# Patient Record
Sex: Male | Born: 1980 | Race: White | Hispanic: No | Marital: Married | Smoking: Former smoker
Health system: Southern US, Community
[De-identification: ages and names within clinical notes are randomized; demographics above are authoritative.]

## PROBLEM LIST (undated history)

## (undated) DIAGNOSIS — G43909 Migraine, unspecified, not intractable, without status migrainosus: Secondary | ICD-10-CM

## (undated) DIAGNOSIS — Z789 Other specified health status: Secondary | ICD-10-CM

## (undated) DIAGNOSIS — K219 Gastro-esophageal reflux disease without esophagitis: Secondary | ICD-10-CM

## (undated) DIAGNOSIS — T7840XA Allergy, unspecified, initial encounter: Secondary | ICD-10-CM

## (undated) HISTORY — DX: Gastro-esophageal reflux disease without esophagitis: K21.9

## (undated) HISTORY — PX: APPENDECTOMY: SHX54

## (undated) HISTORY — PX: TONSILLECTOMY: SUR1361

## (undated) HISTORY — PX: VASECTOMY: SHX75

## (undated) HISTORY — DX: Allergy, unspecified, initial encounter: T78.40XA

---

## 2020-07-20 ENCOUNTER — Ambulatory Visit
Admission: EM | Admit: 2020-07-20 | Discharge: 2020-07-20 | Disposition: A | Discharge status: Home / Self Care | Payer: Commercial Managed Care - PPO | Attending: Family Medicine | Admitting: Family Medicine

## 2020-07-20 ENCOUNTER — Ambulatory Visit: Payer: Self-pay

## 2020-07-20 DIAGNOSIS — R0981 Nasal congestion: Secondary | ICD-10-CM

## 2020-07-20 DIAGNOSIS — H6591 Unspecified nonsuppurative otitis media, right ear: Secondary | ICD-10-CM

## 2020-07-20 DIAGNOSIS — R519 Headache, unspecified: Secondary | ICD-10-CM

## 2020-07-20 DIAGNOSIS — H9201 Otalgia, right ear: Secondary | ICD-10-CM

## 2020-07-20 DIAGNOSIS — Z1152 Encounter for screening for COVID-19: Secondary | ICD-10-CM

## 2020-07-20 DIAGNOSIS — R059 Cough, unspecified: Secondary | ICD-10-CM

## 2020-07-20 HISTORY — DX: Other specified health status: Z78.9

## 2020-07-20 MED ORDER — AMOXICILLIN-POT CLAVULANATE 875-125 MG PO TABS: 1.0000 | ORAL_TABLET | Freq: Two times a day (BID) | ORAL | 0 refills | Status: AC

## 2020-07-20 NOTE — Discharge Instructions (Addendum)
I have sent in Augmentin to your pharmacy  Take the antibiotic twice a day for 10 days  Your COVID test is pending.  You should self quarantine until the test result is back.    Take Tylenol as needed for fever or discomfort.  Rest and keep yourself hydrated.    Go to the emergency department if you develop acute worsening symptoms.

## 2020-07-20 NOTE — ED Provider Notes (Signed)
[facility]   [id number] 07/20/20 Arrival Time: 1549   CC: COVID symptoms  SUBJECTIVE: History from: patient.  Chase White is a 39 y.o. male who presents with abrupt onset of nasal congestion, PND, right ear pain, and persistent dry cough for the last week. Denies sick exposure to COVID, flu or strep. Denies recent travel. Has negative history of Covid. Has not completed Covid vaccines. Has taken OTC cough and cold medications for this with little relief. There are no aggravating or alleviating factors. Denies previous symptoms in the past. Denies fever, chills, fatigue, sinus pain, rhinorrhea, sore throat, SOB, wheezing, chest pain, nausea, changes in bowel or bladder habits.    ROS: As per HPI.  All other pertinent ROS negative.     Past Medical History:  Diagnosis Date   Patient on ketogenic diet    Past Surgical History:  Procedure Laterality Date   APPENDECTOMY     TONSILLECTOMY     VASECTOMY     Allergies  Allergen Reactions   Codeine    No current facility-administered medications on file prior to encounter.   No current outpatient medications on file prior to encounter.   Social History   Socioeconomic History   Marital status: Not on file    Spouse name: Not on file   Number of children: Not on file   Years of education: Not on file   Highest education level: Not on file  Occupational History   Not on file  Tobacco Use   Smoking status: Former Smoker   Smokeless tobacco: Never Used  Vaping Use   Vaping Use: Never used  Substance and Sexual Activity   Alcohol use: Yes    Alcohol/week: 2.0 - 3.0 standard drinks    Types: 2 - 3 Standard drinks or equivalent per week   Drug use: Not on file   Sexual activity: Not on file  Other Topics Concern   Not on file  Social History Narrative   Not on file   Social Determinants of Health   Financial Resource Strain:    Difficulty of Paying Living Expenses: Not on file  Food  Insecurity:    Worried About Running Out of Food in the Last Year: Not on file   Ran Out of Food in the Last Year: Not on file  Transportation Needs:    Lack of Transportation (Medical): Not on file   Lack of Transportation (Non-Medical): Not on file  Physical Activity:    Days of Exercise per Week: Not on file   Minutes of Exercise per Session: Not on file  Stress:    Feeling of Stress : Not on file  Social Connections:    Frequency of Communication with Friends and Family: Not on file   Frequency of Social Gatherings with Friends and Family: Not on file   Attends Religious Services: Not on file   Active Member of Clubs or Organizations: Not on file   Attends Banker Meetings: Not on file   Marital Status: Not on file  Intimate Partner Violence:    Fear of Current or Ex-Partner: Not on file   Emotionally Abused: Not on file   Physically Abused: Not on file   Sexually Abused: Not on file   History reviewed. No pertinent family history.  OBJECTIVE:  Vitals:   07/20/20 1553  BP: 124/79  Pulse: 82  Resp: 12  Temp: 98.4 F (36.9 C)  SpO2: 95%     General appearance: alert; appears fatigued,  but nontoxic; speaking in full sentences and tolerating own secretions HEENT: NCAT; Ears: EACs clear, L TM pearly gray, R TM erythematous, bulging, with effusion; Eyes: PERRL.  EOM grossly intact. Sinuses: nontender; Nose: nares patent without rhinorrhea, Throat: oropharynx clear, tonsils non erythematous or enlarged, uvula midline  Neck: supple without LAD Lungs: unlabored respirations, symmetrical air entry; cough: absent; no respiratory distress; CTAB Heart: regular rate and rhythm.  Radial pulses 2+ symmetrical bilaterally Skin: warm and dry Psychological: alert and cooperative; normal mood and affect  LABS:  No results found for this or any previous visit (from the past 24 hour(s)).   ASSESSMENT & PLAN:  1. Right otitis media with effusion   2.  Cough   3. Nasal congestion   4. Ear pain, right   5. Nonintractable headache, unspecified chronicity pattern, unspecified headache type   6. Encounter for screening for COVID-19     Meds ordered this encounter  Medications   amoxicillin-clavulanate (AUGMENTIN) 875-125 MG tablet    Sig: Take 1 tablet by mouth 2 (two) times daily for 10 days.    Dispense:  20 tablet    Refill:  0    Order Specific Question:   Supervising Provider    Answer:   Merrilee Jansky [id number]    Prescribed Augmentin for OM Take as directed and to completion  COVID testing ordered.  It will take between 1-2 days for test results.  Someone will contact you regarding abnormal results.    Patient should remain in quarantine until they have received Covid results.  If negative you may resume normal activities (go back to work/school) while practicing hand hygiene, social distance, and mask wearing.  If positive, patient should remain in quarantine for 10 days from symptom onset AND greater than 72 hours after symptoms resolution (absence of fever without the use of fever-reducing medication and improvement in respiratory symptoms), whichever is longer Get plenty of rest and push fluids Use OTC zyrtec for nasal congestion, runny nose, and/or sore throat Use OTC flonase for nasal congestion and runny nose Use medications daily for symptom relief Use OTC medications like ibuprofen or tylenol as needed fever or pain Call or go to the ED if you have any new or worsening symptoms such as fever, worsening cough, shortness of breath, chest tightness, chest pain, turning blue, changes in mental status.  Reviewed expectations re: course of current medical issues. Questions answered. Outlined signs and symptoms indicating need for more acute intervention. Patient verbalized understanding. After Visit Summary given.         Moshe Cipro, NP 07/20/20 1622

## 2020-07-20 NOTE — ED Triage Notes (Signed)
Patient reports symptoms began 1 week ago. Reports he originally thought it was allergies, but is having continued sore throat, cough, nasal congestion, and headaches. Reports he did an at home covid test yesterday which came back negative.

## 2020-07-22 LAB — SARS-COV-2, NAA 2 DAY TAT

## 2020-07-22 LAB — NOVEL CORONAVIRUS, NAA: SARS-CoV-2, NAA: NOT DETECTED

## 2020-10-03 ENCOUNTER — Ambulatory Visit
Admission: EM | Admit: 2020-10-03 | Discharge: 2020-10-03 | Disposition: A | Discharge status: Home / Self Care | Payer: Commercial Managed Care - PPO

## 2020-10-03 DIAGNOSIS — G43801 Other migraine, not intractable, with status migrainosus: Secondary | ICD-10-CM

## 2020-10-03 DIAGNOSIS — Z1152 Encounter for screening for COVID-19: Secondary | ICD-10-CM | POA: Diagnosis not present

## 2020-10-03 DIAGNOSIS — R519 Headache, unspecified: Secondary | ICD-10-CM

## 2020-10-03 MED ORDER — KETOROLAC TROMETHAMINE 30 MG/ML IJ SOLN
30.0000 mg | Freq: Once | INTRAMUSCULAR | Status: AC
  Administered 2020-10-03: 30 mg via INTRAMUSCULAR

## 2020-10-03 MED ORDER — RIZATRIPTAN BENZOATE 5 MG PO TABS: 5.0000 mg | ORAL_TABLET | ORAL | 0 refills | Status: DC | PRN

## 2020-10-03 MED ORDER — DEXAMETHASONE SODIUM PHOSPHATE 10 MG/ML IJ SOLN
10.0000 mg | Freq: Once | INTRAMUSCULAR | Status: AC
  Administered 2020-10-03: 10 mg via INTRAMUSCULAR

## 2020-10-03 NOTE — Discharge Instructions (Addendum)
You have received decadron and toradol in the office today  I have sent in Maxalt for you to take as needed for migraine  Your COVID/Flu test is pending.  You should self quarantine until the test result is back.    Take Tylenol as needed for fever or discomfort.  Rest and keep yourself hydrated.    Go to the emergency department if you develop acute worsening symptoms.      Follow up with this office or with primary care if symptoms are persisting.  Follow up in the ER for high fever, trouble swallowing, trouble breathing, other concerning symptoms.

## 2020-10-03 NOTE — ED Triage Notes (Signed)
Pt presents with HA.  Started with migraine, visual disturbances on 10/30.  That lasted a few hours then headache started 10/31.  Location changes in head. Describes as throbbing with periodic stabbing.  Intermittent blurriness in L eye.  No noticeable relief with Tylenol and Ibuprofen.

## 2020-10-03 NOTE — ED Provider Notes (Signed)
[facility]   [id number] 10/03/20 Arrival Time: 1443  CC: HEADACHE  SUBJECTIVE:  Chase White is a 39 y.o. male who complains of headache for the last 9 days. Denies a precipitating event, or recent head trauma. Describes the pain as constant and throbbing in character. Patient has tried OTC ibuprofen and tylenol without relief. There are no aggravating or alleviating factors. Reports similar symptoms in the past that improved with tylenol. Reports that he had a televisit and they suggested that he come in and be tested for Covid. Requesting testing today. This is not the worst headache of their life. Patient denies fever, chills, nausea, vomiting, aura, rhinorrhea, watery eyes, chest pain, SOB, abdominal pain, weakness, numbness or tingling, slurred speech.     ROS: As per HPI.  All other pertinent ROS negative.     Past Medical History:  Diagnosis Date  . Patient on ketogenic diet    Past Surgical History:  Procedure Laterality Date  . APPENDECTOMY    . TONSILLECTOMY    . VASECTOMY     Allergies  Allergen Reactions  . Codeine    No current facility-administered medications on file prior to encounter.   Current Outpatient Medications on File Prior to Encounter  Medication Sig Dispense Refill  . polyethylene glycol (MIRALAX / GLYCOLAX) 17 g packet Take 17 g by mouth daily.     Social History   Socioeconomic History  . Marital status: Married    Spouse name: Not on file  . Number of children: Not on file  . Years of education: Not on file  . Highest education level: Not on file  Occupational History  . Not on file  Tobacco Use  . Smoking status: Former Games developer  . Smokeless tobacco: Never Used  Vaping Use  . Vaping Use: Never used  Substance and Sexual Activity  . Alcohol use: Yes    Alcohol/week: 2.0 - 3.0 standard drinks    Types: 2 - 3 Standard drinks or equivalent per week  . Drug use: Not on file  . Sexual activity: Not on file  Other Topics  Concern  . Not on file  Social History Narrative  . Not on file   Social Determinants of Health   Financial Resource Strain:   . Difficulty of Paying Living Expenses: Not on file  Food Insecurity:   . Worried About Programme researcher, broadcasting/film/video in the Last Year: Not on file  . Ran Out of Food in the Last Year: Not on file  Transportation Needs:   . Lack of Transportation (Medical): Not on file  . Lack of Transportation (Non-Medical): Not on file  Physical Activity:   . Days of Exercise per Week: Not on file  . Minutes of Exercise per Session: Not on file  Stress:   . Feeling of Stress : Not on file  Social Connections:   . Frequency of Communication with Friends and Family: Not on file  . Frequency of Social Gatherings with Friends and Family: Not on file  . Attends Religious Services: Not on file  . Active Member of Clubs or Organizations: Not on file  . Attends Banker Meetings: Not on file  . Marital Status: Not on file  Intimate Partner Violence:   . Fear of Current or Ex-Partner: Not on file  . Emotionally Abused: Not on file  . Physically Abused: Not on file  . Sexually Abused: Not on file   No family history on file.  OBJECTIVE:  Vitals:   10/03/20 1519  BP: 120/76  Resp: 18  Temp: 99.2 F (37.3 C)  TempSrc: Oral  SpO2: 97%    General appearance: alert; no distress Eyes: PERRLA; EOMI HENT: normocephalic; atraumatic Neck: supple with FROM Lungs: clear to auscultation bilaterally Heart: regular rate and rhythm.  Radial pulses 2+ symmetrical bilaterally Extremities: no edema; symmetrical with no gross deformities Skin: warm and dry Neurologic: CN 2-12 grossly intact; finger to nose without difficulty; normal gait; strength and sensation intact bilaterally about the upper and lower extremities; negative pronator drift Psychological: alert and cooperative; normal mood and affect   ASSESSMENT & PLAN:  1. Other migraine with status migrainosus, not  intractable   2. Nonintractable headache, unspecified chronicity pattern, unspecified headache type   3. Encounter for screening for COVID-19     Meds ordered this encounter  Medications  . dexamethasone (DECADRON) injection 10 mg  . ketorolac (TORADOL) 30 MG/ML injection 30 mg  . rizatriptan (MAXALT) 5 MG tablet    Sig: Take 1 tablet (5 mg total) by mouth as needed for migraine. May repeat in 2 hours if needed    Dispense:  10 tablet    Refill:  0    Order Specific Question:   Supervising Provider    Answer:   Merrilee Jansky [[id number]]    Decadron 10mg  IM in office today Toradol 30mg  IM in office today Prescribed Maxalt Migraine cocktail given in office Rest and drink plenty of fluids Use OTC medications as needed for symptomatic relief Covid/Flu swab obtained in office today.  Patient instructed to quarantine until results are back and negative.  If results are negative, patient may resume daily schedule as tolerated once they are fever free for 24 hours without the use of antipyretic medications.  If results are positive, patient instructed to quarantine 10 days from today.  Patient instructed to follow-up with primary care with this office as needed.  Patient instructed to follow-up in the ER for trouble swallowing, trouble breathing, other concerning symptoms.  Reviewed expectations re: course of current medical issues. Questions answered. Outlined signs and symptoms indicating need for more acute intervention. Patient verbalized understanding. After Visit Summary given.   , NP 10/03/20 1553

## 2020-10-04 LAB — COVID-19, FLU A+B AND RSV
Influenza A, NAA: NOT DETECTED
Influenza B, NAA: NOT DETECTED
RSV, NAA: NOT DETECTED
SARS-CoV-2, NAA: NOT DETECTED

## 2020-10-13 ENCOUNTER — Other Ambulatory Visit: Payer: Self-pay | Admitting: Nurse Practitioner

## 2020-10-13 DIAGNOSIS — R42 Dizziness and giddiness: Secondary | ICD-10-CM

## 2020-10-13 DIAGNOSIS — G43019 Migraine without aura, intractable, without status migrainosus: Secondary | ICD-10-CM

## 2020-10-13 DIAGNOSIS — H8112 Benign paroxysmal vertigo, left ear: Secondary | ICD-10-CM

## 2020-10-14 ENCOUNTER — Other Ambulatory Visit: Payer: Self-pay | Admitting: Nurse Practitioner

## 2020-10-17 ENCOUNTER — Other Ambulatory Visit: Payer: Self-pay | Admitting: Nurse Practitioner

## 2020-10-18 ENCOUNTER — Ambulatory Visit
Admission: RE | Admit: 2020-10-18 | Discharge: 2020-10-18 | Disposition: A | Discharge status: Home / Self Care | Payer: Commercial Managed Care - PPO | Source: Ambulatory Visit | Attending: Nurse Practitioner | Admitting: Nurse Practitioner

## 2020-10-18 DIAGNOSIS — R42 Dizziness and giddiness: Secondary | ICD-10-CM

## 2020-10-18 DIAGNOSIS — G43019 Migraine without aura, intractable, without status migrainosus: Secondary | ICD-10-CM

## 2020-10-18 DIAGNOSIS — H8112 Benign paroxysmal vertigo, left ear: Secondary | ICD-10-CM

## 2020-10-27 ENCOUNTER — Encounter: Payer: Self-pay | Admitting: Emergency Medicine

## 2020-10-27 ENCOUNTER — Emergency Department
Admission: EM | Admit: 2020-10-27 | Discharge: 2020-10-27 | Disposition: A | Discharge status: Home / Self Care | Payer: Commercial Managed Care - PPO | Attending: Emergency Medicine | Admitting: Emergency Medicine

## 2020-10-27 ENCOUNTER — Other Ambulatory Visit: Payer: Self-pay

## 2020-10-27 DIAGNOSIS — G43909 Migraine, unspecified, not intractable, without status migrainosus: Secondary | ICD-10-CM | POA: Insufficient documentation

## 2020-10-27 DIAGNOSIS — G8929 Other chronic pain: Secondary | ICD-10-CM | POA: Diagnosis not present

## 2020-10-27 DIAGNOSIS — R519 Headache, unspecified: Secondary | ICD-10-CM | POA: Diagnosis present

## 2020-10-27 DIAGNOSIS — G43719 Chronic migraine without aura, intractable, without status migrainosus: Secondary | ICD-10-CM

## 2020-10-27 DIAGNOSIS — Z87891 Personal history of nicotine dependence: Secondary | ICD-10-CM | POA: Diagnosis not present

## 2020-10-27 NOTE — ED Provider Notes (Signed)
[facility] Emergency Department Provider Note   ____________________________________________   First MD Initiated Contact with Patient 10/27/20 1031     (approximate)  I have reviewed the triage vital signs and the nursing notes.   HISTORY  Chief Complaint Headache    HPI Chase White is a 39 y.o. male patient presents with continued headache for 1 month.  Patient was evaluated by his PCP and also had a negative CT scan  last month.  Patient state mild transient relief with Maxalt.  Patient also stated headaches increased with computer screen usage.  Patient voices concern with his PCP but has not follow-up with ophthalmologist because he had a exam in February or March of this year.  Patient denies aura with this headache.  Patient denies photo sensitivity.   Patient  request MRI today.   Past Medical History:  Diagnosis Date  . Patient on ketogenic diet     There are no problems to display for this patient.   Past Surgical History:  Procedure Laterality Date  . APPENDECTOMY    . TONSILLECTOMY    . VASECTOMY      Prior to Admission medications   Medication Sig Start Date End Date Taking? Authorizing Provider  polyethylene glycol (MIRALAX / GLYCOLAX) 17 g packet Take 17 g by mouth daily.    [provider]  rizatriptan (MAXALT) 5 MG tablet Take 1 tablet (5 mg total) by mouth as needed for migraine. May repeat in 2 hours if needed 10/03/20   Moshe Cipro, NP    Allergies Codeine  History reviewed. No pertinent family history.  Social History Social History   Tobacco Use  . Smoking status: Former Games developer  . Smokeless tobacco: Never Used  Vaping Use  . Vaping Use: Never used  Substance Use Topics  . Alcohol use: Yes    Alcohol/week: 2.0 - 3.0 standard drinks    Types: 2 - 3 Standard drinks or equivalent per week  . Drug use: Not on file    Review of Systems Constitutional: No fever/chills Eyes: No visual  changes. ENT: No sore throat. Cardiovascular: Denies chest pain. Respiratory: Denies shortness of breath. Gastrointestinal: No abdominal pain.  No nausea, no vomiting.  No diarrhea.  No constipation. Genitourinary: Negative for dysuria. Musculoskeletal: Negative for back pain. Skin: Negative for rash. Neurological: Positive for headaches, but denies focal weakness or numbness. Allergic/Immunilogical: Codeine.  ____________________________________________   PHYSICAL EXAM:  VITAL SIGNS: ED Triage Vitals [10/27/20 0955]  Enc Vitals Group     BP 131/72     Pulse Rate 82     Resp 18     Temp 98.5 F (36.9 C)     Temp Source Oral     SpO2 96 %     Weight 180 lb (81.6 kg)     Height 5\' 11"  (1.803 m)     Head Circumference      Peak Flow      Pain Score 2     Pain Loc      Pain Edu?      Excl. in GC?    Constitutional: Alert and oriented. Well appearing and in no acute distress. Head: Atraumatic. Cardiovascular: Normal rate, regular rhythm. Grossly normal heart sounds.  Good peripheral circulation. Respiratory: Normal respiratory effort.  No retractions. Lungs CTAB. Gastrointestinal: Soft and nontender. No distention. No abdominal bruits. No CVA tenderness. Musculoskeletal: No lower extremity tenderness nor edema.  No joint effusions. Neurologic:  Normal speech and language. No  gross focal neurologic deficits are appreciated. No gait instability. Skin:  Skin is warm, dry and intact. No rash noted. Psychiatric: Mood and affect are normal. Speech and behavior are normal.  ____________________________________________   LABS (all labs ordered are listed, but only abnormal results are displayed)  Labs Reviewed - No data to display ____________________________________________  EKG  ____________________________________________  RADIOLOGY I, Joni Reining, personally viewed and evaluated these images (plain radiographs) as part of my medical decision making, as well as  reviewing the written report by the radiologist.  ED MD interpretation:    Official radiology report(s): No results found.  ____________________________________________   PROCEDURES  Procedure(s) performed (including Critical Care):  Procedures   ____________________________________________   INITIAL IMPRESSION / ASSESSMENT AND PLAN / ED COURSE  As part of my medical decision making, I reviewed the following data within the electronic MEDICAL RECORD NUMBER         Patient presents with continued headache for 4 months.  Patient states mild improvement since starting Maxalt.  Explained rationale for not performing emergent MRI of his head.  Patient given discharge care instruction and a consult to neurology.  Patient also advised to schedule an appointment with his optometrist/ophthalmologist for evaluation of computer screen headache.   ____________________________________________   FINAL CLINICAL IMPRESSION(S) / ED DIAGNOSES  Final diagnoses:  Intractable chronic migraine without aura and without status migrainosus     ED Discharge Orders    None      *Please note:  Chase White was evaluated in Emergency Department on 10/27/2020 for the symptoms described in the history of present illness. He was evaluated in the context of the global COVID-19 pandemic, which necessitated consideration that the patient might be at risk for infection with the SARS-CoV-2 virus that causes COVID-19. Institutional protocols and algorithms that pertain to the evaluation of patients at risk for COVID-19 are in a state of rapid change based on information released by regulatory bodies including the CDC and federal and state organizations. These policies and algorithms were followed during the patient's care in the ED.  Some ED evaluations and interventions may be delayed as a result of limited staffing during and the pandemic.*   Note:  This document was prepared using Dragon voice recognition  software and may include unintentional dictation errors.    Joni Reining, PA-C 10/27/20 1157    Arnaldo Natal, MD 10/27/20 [phone]

## 2020-10-27 NOTE — Discharge Instructions (Addendum)
Follow discharge care instruction and continue previous medication.  Call the neurologist listed on your discharge care instructions.  Tell them you are a follow-up from emergency room.  Also advised you to see optometrist/ophthalmologist to evaluate your computer screen headaches.

## 2020-10-27 NOTE — ED Triage Notes (Signed)
Pt comes into the ED via POV c/o headache x 1 month.  Pt is currently neurologically intact but states the headache is only worse when looking at his computer screen.  Pt does wear Rx glasses with last eye exam feb/march of this year.  Pt currently on meclizine for dizzy spells and takes it as needed.  Pt in NAD at this time with even and unlabored respirations.

## 2020-10-28 ENCOUNTER — Encounter: Payer: Self-pay | Admitting: Neurology

## 2020-11-15 NOTE — Progress Notes (Signed)
NEUROLOGY CONSULTATION NOTE  Nasim Habeeb MRN: [id number] DOB: May 08, 1981  Referring provider: ED referral Primary care provider: [facility]  Reason for consult:  migraine   Subjective:  Chase White is a 39 year old right-handed male who presents for migraines.  History supplemented by ED notes.  He had a first-time migraine over this past year.  it was severe but can't remember head location.  It was preceded by a streak of light across his vision, and associated with nausea, motion sickness, photophobia and phonophobia.  No weakness and numbness.  No known trigger.  It lasted about 4-5 hours and resolved  He had a second similar headache on 10/30 that occurred spontaneously.  Over the next week it would come and go without aura, lasting a couple of hours.  He went to [facility] on 10/03/2020 for intractable headache after being advised to be tested for COVID-19, which was negative.  He had a CT of the head on 10/18/2020 which was personally reviewed and was negative.  It continued to be daily, but without aura.  He was placed on daily Ubrelvy which helped.  Due to ongoing headaches, he went to the ED at [facility] on 10/27/2020 where he was given referral to neurology and advised to follow up with his eye doctor.  Last severe headache roughly a week later.  He still has a dull persistent left retro-orbital pressure.  He has been taking ibuprofen about once a week.  Work is all on computer so screen time aggravates it.  Since these headaches, he has been very dizzy and prone to motion sickness, such as riding in a car.  Current NSAIDS/analgesics:  Ibuprofen 800mg  Current triptans:  Maxalt 5mg  Current ergotamine:  none Current anti-emetic:  none Current muscle relaxants:  none Current Antihypertensive medications:  none Current Antidepressant medications:  none Current Anticonvulsant medications:  none Current anti-CGRP:  none Current Vitamins/Herbal/Supplements:   none Current Antihistamines/Decongestants:  none Other therapy:  none Hormone/birth control:  none  Past NSAIDS/analgesics:  none Past abortive triptans:  none Past abortive ergotamine:  none Past muscle relaxants:  none Past anti-emetic:  none Past antihypertensive medications:  none Past antidepressant medications:  none Past anticonvulsant medications:  none Past anti-CGRP:  Past vitamins/Herbal/Supplements:  none Past antihistamines/decongestants:  meclizine Other past therapies:  none  Caffeine:  Drinks on average 2 cups coffee daily.  No soda. Diet:  Drinks plenty of water.  Keto diet (over 3 years) Exercise:  not Depression:  no; Anxiety:  no Other pain:  Shoulder pain Sleep hygiene: lately restless but usually good. Family history of headache:  He thinks migraines may run in the family.   PAST MEDICAL HISTORY: Past Medical History:  Diagnosis Date  . Patient on ketogenic diet     PAST SURGICAL HISTORY: Past Surgical History:  Procedure Laterality Date  . APPENDECTOMY    . TONSILLECTOMY    . VASECTOMY      MEDICATIONS: Current Outpatient Medications on File Prior to Visit  Medication Sig Dispense Refill  . polyethylene glycol (MIRALAX / GLYCOLAX) 17 g packet Take 17 g by mouth daily.    . rizatriptan (MAXALT) 5 MG tablet Take 1 tablet (5 mg total) by mouth as needed for migraine. May repeat in 2 hours if needed 10 tablet 0   No current facility-administered medications on file prior to visit.    ALLERGIES: Allergies  Allergen Reactions  . Codeine     FAMILY HISTORY: History reviewed. No  pertinent family history.   SOCIAL HISTORY: Social History   Socioeconomic History  . Marital status: Married    Spouse name: Not on file  . Number of children: Not on file  . Years of education: Not on file  . Highest education level: Not on file  Occupational History  . Not on file  Tobacco Use  . Smoking status: Former Games developer  . Smokeless  tobacco: Never Used  Vaping Use  . Vaping Use: Never used  Substance and Sexual Activity  . Alcohol use: Yes    Alcohol/week: 2.0 - 3.0 standard drinks    Types: 2 - 3 Standard drinks or equivalent per week  . Drug use: Not on file  . Sexual activity: Not on file  Other Topics Concern  . Not on file  Social History Narrative  . Not on file   Social Determinants of Health   Financial Resource Strain: Not on file  Food Insecurity: Not on file  Transportation Needs: Not on file  Physical Activity: Not on file  Stress: Not on file  Social Connections: Not on file  Intimate Partner Violence: Not on file    Objective:  Blood pressure 104/71, pulse 97, height 5\' 11"  (1.803 m), weight 187 lb (84.8 kg), SpO2 91 %. General: No acute distress.  Patient appears well-groomed.   Head:  Normocephalic/atraumatic Eyes:  fundi examined but not visualized Neck: supple, no paraspinal tenderness, full range of motion Back: No paraspinal tenderness Heart: regular rate and rhythm Lungs: Clear to auscultation bilaterally. Vascular: No carotid bruits. Neurological Exam: Mental status: alert and oriented to person, place, and time, recent and remote memory intact, fund of knowledge intact, attention and concentration intact, speech fluent and not dysarthric, language intact. Cranial nerves: CN I: not tested CN II: pupils equal, round and reactive to light, visual fields intact CN III, IV, VI:  full range of motion, no nystagmus, no ptosis CN V: facial sensation intact. CN VII: upper and lower face symmetric CN VIII: hearing intact CN IX, X: gag intact, uvula midline CN XI: sternocleidomastoid and trapezius muscles intact CN XII: tongue midline Bulk & Tone: normal, no fasciculations. Motor:  muscle strength 5/5 throughout Sensation:  Pinprick, temperature and vibratory sensation intact. Deep Tendon Reflexes:  2+ throughout,  toes downgoing.   Finger to nose testing:  Without dysmetria.    Heel to shin:  Without dysmetria.   Gait:  Normal station and stride.  Romberg negative.  Assessment/Plan:   1.  Migraine with aura, without status migrainosus, not intractable 2.  New daily headaches 3.  Dizziness/dysequilibrium   1.  As these are new daily headaches, we will order MRI of brain with and without contrast to rule out secondary intracranial abnormality 2.  For migraine prevention:  Nortriptyline 25mg  at bedtime.  We can increase to 50mg  at bedtime in 4 to 6 weeks if needed. 3.  For migraine rescue:  Maxalt 10mg .  Ibuprofen for more mild headaches 4.  Limit use of pain relievers to no more than 2 days out of week to prevent risk of rebound or medication-overuse headache. 5.  Keep headache diary 6.  Recommend following accommodations for work:  Anti blue light screen filter for his computer and to avoid driving or riding in a vehicle at this time. 7.  Caffeine cessation, exercise 8.  Follow up 4 to 6 months.    Thank you for allowing me to take part in the care of this patient.  , DO

## 2020-11-16 ENCOUNTER — Ambulatory Visit (INDEPENDENT_AMBULATORY_CARE_PROVIDER_SITE_OTHER): Payer: Commercial Managed Care - PPO | Admitting: Neurology

## 2020-11-16 ENCOUNTER — Encounter: Payer: Self-pay | Admitting: Neurology

## 2020-11-16 ENCOUNTER — Other Ambulatory Visit: Payer: Self-pay

## 2020-11-16 VITALS — BP 104/71 | HR 97 | Ht 71.0 in | Wt 187.0 lb

## 2020-11-16 DIAGNOSIS — R42 Dizziness and giddiness: Secondary | ICD-10-CM | POA: Diagnosis not present

## 2020-11-16 DIAGNOSIS — G43109 Migraine with aura, not intractable, without status migrainosus: Secondary | ICD-10-CM

## 2020-11-16 DIAGNOSIS — R519 Headache, unspecified: Secondary | ICD-10-CM | POA: Diagnosis not present

## 2020-11-16 MED ORDER — NORTRIPTYLINE HCL 25 MG PO CAPS: 25.0000 mg | ORAL_CAPSULE | Freq: Every day | ORAL | 5 refills | Status: DC

## 2020-11-16 NOTE — Patient Instructions (Signed)
  1. MRI of brain with and without contrast 2. Start nortriptyline 25mg  at bedtime.  Contact in 4 to 6 weeks with update and we can increase dose if needed. 3. Take rizatriptan 10mg  at earliest onset of headache.  May repeat dose once in 2 hours if needed.  Maximum 2 tablets in 24 hours. 4. Limit use of pain relievers to no more than 2 days out of the week.  These medications include acetaminophen, NSAIDs (ibuprofen/Advil/Motrin, naproxen/Aleve, triptans (Imitrex/sumatriptan), Excedrin, and narcotics.  This will help reduce risk of rebound headaches. 5. Be aware of common food triggers:  - Caffeine:  coffee, black tea, cola, Mt. Dew  - Chocolate  - Dairy:  aged cheeses (brie, blue, cheddar, gouda, [place], provolone, [place], Swiss, etc), chocolate milk, buttermilk, sour cream, limit eggs and yogurt  - Nuts, peanut butter  - Alcohol  - Cereals/grains:  FRESH breads (fresh bagels, sourdough, doughnuts), yeast productions  - Processed/canned/aged/cured meats (pre-packaged deli meats, hotdogs)  - MSG/glutamate:  soy sauce, flavor enhancer, pickled/preserved/marinated foods  - Sweeteners:  aspartame (Equal, Nutrasweet).  Sugar and Splenda are okay  - Vegetables:  legumes (lima beans, lentils, snow peas, fava beans, pinto peans, peas, garbanzo beans), sauerkraut, onions, olives, pickles  - Fruit:  avocados, bananas, citrus fruit (orange, lemon, grapefruit), mango  - Other:  Frozen meals, macaroni and cheese 6. Routine exercise 7. Stay adequately hydrated (aim for 64 oz water daily) 8. Keep headache diary 9. Maintain proper stress management 10. Maintain proper sleep hygiene 11. Do not skip meals 12. Consider supplements:  magnesium citrate 400mg  daily, riboflavin 400mg  daily, coenzyme Q10 100mg  three times daily. 13. Follow up 4 to 6 months.

## 2020-12-05 ENCOUNTER — Telehealth: Payer: Self-pay | Admitting: Neurology

## 2020-12-05 NOTE — Telephone Encounter (Signed)
Patient called and said, "The medication, nortriptyline, is working well but it is wearing off before I can take another dose. Also, my heart rate has been high like 120 when I am sitting. Should I be concerned?"  [facility] [place]

## 2020-12-06 MED ORDER — TOPIRAMATE 25 MG PO TABS: ORAL_TABLET | ORAL | 3 refills | Status: DC

## 2020-12-06 NOTE — Telephone Encounter (Signed)
Pt advised of medication change and instructions

## 2020-12-06 NOTE — Telephone Encounter (Signed)
I would stop nortriptyline.  Instead, I would like to start topiramate 25mg  tablet.  Please send script for 25mg  at bedtime for one week, then increase to 50mg  at bedtime.  If no improvement by time of refill, he should contact and we can increase dose.  One side effect of the medication that some people may experience is numbness and tingling, so if he experiences this, not to be worried and it typically resolves once his body gets used to the medication.

## 2020-12-08 ENCOUNTER — Other Ambulatory Visit: Payer: Self-pay | Admitting: Neurology

## 2020-12-10 ENCOUNTER — Other Ambulatory Visit: Payer: Self-pay

## 2020-12-10 ENCOUNTER — Ambulatory Visit
Admission: RE | Admit: 2020-12-10 | Discharge: 2020-12-10 | Disposition: A | Discharge status: Home / Self Care | Payer: Commercial Managed Care - PPO | Source: Ambulatory Visit | Attending: Neurology | Admitting: Neurology

## 2020-12-10 DIAGNOSIS — R519 Headache, unspecified: Secondary | ICD-10-CM

## 2020-12-10 DIAGNOSIS — G43109 Migraine with aura, not intractable, without status migrainosus: Secondary | ICD-10-CM

## 2020-12-10 DIAGNOSIS — R42 Dizziness and giddiness: Secondary | ICD-10-CM

## 2020-12-10 MED ORDER — GADOBENATE DIMEGLUMINE 529 MG/ML IV SOLN
15.0000 mL | Freq: Once | INTRAVENOUS | Status: AC | PRN
  Administered 2020-12-10: 15 mL via INTRAVENOUS

## 2020-12-12 ENCOUNTER — Ambulatory Visit: Payer: Commercial Managed Care - PPO | Admitting: Neurology

## 2020-12-12 NOTE — Progress Notes (Signed)
Pt advised of his Mri results. Please keep scheduled f/u appt.

## 2021-03-23 ENCOUNTER — Other Ambulatory Visit: Payer: Self-pay

## 2021-03-23 ENCOUNTER — Telehealth: Payer: Self-pay | Admitting: Neurology

## 2021-03-23 DIAGNOSIS — G43801 Other migraine, not intractable, with status migrainosus: Secondary | ICD-10-CM

## 2021-03-23 MED ORDER — RIZATRIPTAN BENZOATE 5 MG PO TABS: 5.0000 mg | ORAL_TABLET | ORAL | 0 refills | Status: DC | PRN

## 2021-03-23 NOTE — Telephone Encounter (Signed)
Rx sent in to pharmacy. 

## 2021-03-23 NOTE — Telephone Encounter (Signed)
1. Which medications need to be refilled? (please list name of each medication and dose if known) Maxalt   2. Which pharmacy/location (including street and city if local pharmacy) is medication to be sent to? [facility] [place], [place]

## 2021-04-17 NOTE — Progress Notes (Signed)
NEUROLOGY FOLLOW UP OFFICE NOTE  Chase White [id number]  Assessment/Plan:   1.  Migraine with aura, without status migrainosus, not intractable 2.  Dizziness/disequilibrium, resolved 3.  Neck pain/left shoulder pain  1.  Migraine prevention:  Increase topiramate to 75mg  at bedtime 2.  Migraine rescue:  Rizatriptan 10mg  (for severe) or ibuprofen (for moderate) 3.  Refer to Sports Medicine regarding neck and left shoulder pain 4.  Limit use of pain relievers to no more than 2 days out of week to prevent risk of rebound or medication-overuse headache. 5.  Keep headache diary 6.  Follow up 6 months.  Subjective:  Chase White is a 40 year old right-handed male who follows up for migraine.  UPDATE: MRI of brain with and without contrast on 12/10/2020 personally reviewed was normal. Nortriptyline caused tachycardia, so he was changed to topiramate.  Intensity:  Moderate to severe Duration:  Less than an hour with rizatriptan, less than one hour but sometimes longer with the ibuprofen) Frequency:  3 a week (1 a week requiring rizatriptan)  Since starting topiramate, he keeps clenching his jaw which causes bi-temporal headaches.  However, he reports increased stress.  Since accident, he continues to have left shoulder pain radiating up the left side of his neck.   Current NSAIDS/analgesics:  Ibuprofen 800mg  Current triptans:  Maxalt 10mg  Current ergotamine:  none Current anti-emetic:  none Current muscle relaxants:  none Current Antihypertensive medications:  none Current Antidepressant medications:  none Current Anticonvulsant medications:  topiramate 50mg  QHS Current anti-CGRP:  none Current Vitamins/Herbal/Supplements:  none Current Antihistamines/Decongestants:  none Other therapy:  none Hormone/birth control:  none  Caffeine:  Drinks on average 2 cups coffee daily.  No soda. Diet:  Drinks plenty of water.  Keto diet (over 3 years) Exercise:  not Depression:   no; Anxiety:  no Other pain:  Shoulder pain Sleep hygiene: lately restless but usually good.  HISTORY: He had a first-time migraine over this past year.  it was severe but can't remember head location.  It was preceded by a streak of light across his vision, and associated with nausea, motion sickness, photophobia and phonophobia.  No weakness and numbness.  No known trigger.  It lasted about 4-5 hours and resolved  He had a second similar headache on 10/30 that occurred spontaneously.  Over the next week it would come and go without aura, lasting a couple of hours.  He went to [facility] on 10/03/2020 for intractable headache after being advised to be tested for COVID-19, which was negative.  He had a CT of the head on 10/18/2020 which was personally reviewed and was negative.  It continued to be daily, but without aura.  He was placed on daily Ubrelvy which helped.  Due to ongoing headaches, he went to the ED at [facility] on 10/27/2020 where he was given referral to neurology and advised to follow up with his eye doctor.  Last severe headache roughly a week later.  He still has a dull persistent left retro-orbital pressure.  He has been taking ibuprofen about once a week.  Work is all on computer so screen time aggravates it.  Since these headaches, he has been very dizzy and prone to motion sickness, such as riding in a car.   Past NSAIDS/analgesics:  none Past abortive triptans:  none Past abortive ergotamine:  none Past muscle relaxants:  none Past anti-emetic:  none Past antihypertensive medications:  none Past antidepressant medications:  nortriptyline (tachycardia) Past anticonvulsant  medications:  none Past anti-CGRP:  Bernita Raisin Past vitamins/Herbal/Supplements:  none Past antihistamines/decongestants:  meclizine Other past therapies:  none   Family history of headache:  He thinks migraines may run in the family.  PAST MEDICAL HISTORY: Past Medical History:  Diagnosis Date   . Patient on ketogenic diet     MEDICATIONS: Current Outpatient Medications on File Prior to Visit  Medication Sig Dispense Refill  . ibuprofen (ADVIL) 800 MG tablet Take 800 mg by mouth as needed.    . nortriptyline (PAMELOR) 25 MG capsule Take 1 capsule (25 mg total) by mouth at bedtime. 30 capsule 5  . polyethylene glycol (MIRALAX / GLYCOLAX) 17 g packet Take 17 g by mouth daily.    . rizatriptan (MAXALT) 5 MG tablet Take 1 tablet (5 mg total) by mouth as needed for migraine. May repeat in 2 hours if needed 10 tablet 0  . topiramate (TOPAMAX) 25 MG tablet Take 25mg  at bedtime for one week, then increase to 50mg  at bedtime. Pleas contact if you need to increase after 4 weeks 120 tablet 3   No current facility-administered medications on file prior to visit.    ALLERGIES: Allergies  Allergen Reactions  . Codeine     FAMILY HISTORY: No family history on file.    Objective:  Blood pressure 121/79, pulse 95, height 5\' 11"  (1.803 m), weight 188 lb (85.3 kg), SpO2 98 %. General: No acute distress.  Patient appears well-groomed.        , DO

## 2021-04-18 ENCOUNTER — Encounter: Payer: Self-pay | Admitting: Neurology

## 2021-04-18 ENCOUNTER — Other Ambulatory Visit: Payer: Self-pay

## 2021-04-18 ENCOUNTER — Ambulatory Visit (INDEPENDENT_AMBULATORY_CARE_PROVIDER_SITE_OTHER): Payer: Commercial Managed Care - PPO | Admitting: Neurology

## 2021-04-18 VITALS — BP 121/79 | HR 95 | Ht 71.0 in | Wt 188.0 lb

## 2021-04-18 DIAGNOSIS — G43109 Migraine with aura, not intractable, without status migrainosus: Secondary | ICD-10-CM | POA: Diagnosis not present

## 2021-04-18 DIAGNOSIS — M542 Cervicalgia: Secondary | ICD-10-CM

## 2021-04-18 DIAGNOSIS — M25512 Pain in left shoulder: Secondary | ICD-10-CM

## 2021-04-18 MED ORDER — TOPIRAMATE 25 MG PO TABS: 75.0000 mg | ORAL_TABLET | Freq: Every day | ORAL | 5 refills | Status: DC

## 2021-04-18 NOTE — Patient Instructions (Signed)
1.  Increase topiramate to 75mg  at bedtime 2.  Use rizatriptan or ibuprofen as needed.  But limit use of pain relievers to no more than 2 days out of week to prevent risk of rebound or medication-overuse headache. 3.  Refer to Sports Medicine for neck pain and left shoulder pain 4.  Follow up 6 months.

## 2021-04-20 NOTE — Progress Notes (Signed)
[facility] [address] [place] [address] Phone: [phone] Subjective:   I Chase White am serving as a Neurosurgeon for Dr. Antoine Primas.  This visit occurred during the SARS-CoV-2 public health emergency.  Safety protocols were in place, including screening questions prior to the visit, additional usage of staff PPE, and extensive cleaning of exam room while observing appropriate contact time as indicated for disinfecting solutions.   I'm seeing this patient by the request  of:  Jaffe DO  CC: neck pain   FKC:[id number]  Chase White is a 40 y.o. male coming in with complaint of neck and L shoulder pain. History of migraines. Patient states he thinks he injured his shoulder doing a virtual boxing class. Pain radiates the the left side of the neck. States he felt a pop before in the neck where his ear went numb for about a week. Some numbness in the fingertips that he believes is caused by his migraine medication. Shoulder and migraine medication was started at the same time so he is not sure if the shoulder is also causing the numbness. Pain comes and goes. States when he feels the pain he cannot move.   Onset- 6 months Location - anterior  Duration- most painful at night  Character- sharp, dull, achy, sore, throbbing Aggravating factors- extension, abduction, flexion, picking things up, quick movements, sleeping on the shoulder  Reliving factors-  Therapies tried- pain relief, heating pad  Severity- 9/10 at its worse    Patient did have an MRI of the brain done by his neurologist.  This was independently visualized by me and no significant abnormality noted.  Past Medical History:  Diagnosis Date  . Patient on ketogenic diet    Past Surgical History:  Procedure Laterality Date  . APPENDECTOMY    . TONSILLECTOMY    . VASECTOMY     Social History   Socioeconomic History  . Marital status: Married    Spouse name: Not on file  . Number  of children: Not on file  . Years of education: Not on file  . Highest education level: Not on file  Occupational History  . Not on file  Tobacco Use  . Smoking status: Former Games developer  . Smokeless tobacco: Never Used  Vaping Use  . Vaping Use: Never used  Substance and Sexual Activity  . Alcohol use: Yes    Alcohol/week: 2.0 - 3.0 standard drinks    Types: 2 - 3 Standard drinks or equivalent per week    Comment: occ.  . Drug use: Never  . Sexual activity: Yes  Other Topics Concern  . Not on file  Social History Narrative   Right handed   Social Determinants of Health   Financial Resource Strain: Not on file  Food Insecurity: Not on file  Transportation Needs: Not on file  Physical Activity: Not on file  Stress: Not on file  Social Connections: Not on file   Allergies  Allergen Reactions  . Codeine    No family history on file.     Current Outpatient Medications (Analgesics):  .  ibuprofen (ADVIL) 800 MG tablet, Take 800 mg by mouth as needed. .  rizatriptan (MAXALT) 5 MG tablet, Take 1 tablet (5 mg total) by mouth as needed for migraine. May repeat in 2 hours if needed   Current Outpatient Medications (Other):  .  polyethylene glycol (MIRALAX / GLYCOLAX) 17 g packet, Take 17 g by mouth daily. Marland Kitchen  topiramate (TOPAMAX)  25 MG tablet, Take 3 tablets (75 mg total) by mouth at bedtime. Marland Kitchen  venlafaxine XR (EFFEXOR XR) 37.5 MG 24 hr capsule, Take 1 capsule (37.5 mg total) by mouth daily with breakfast.   Reviewed prior external information including notes and imaging from  primary care provider As well as notes that were available from care everywhere and other healthcare systems.  Past medical history, social, surgical and family history all reviewed in electronic medical record.  No pertanent information unless stated regarding to the chief complaint.   Review of Systems:  No  visual changes, nausea, vomiting, diarrhea, constipation, dizziness, abdominal pain, skin  rash, fevers, chills, night sweats, weight loss, swollen lymph nodes, body aches, joint swelling, chest pain, shortness of breath, mood changes. POSITIVE muscle aches, headache  Objective  Blood pressure 138/80, pulse (!) 103, height 5\' 11"  (1.803 m), weight 188 lb (85.3 kg), SpO2 97 %.   General: No apparent distress alert and oriented x3 mood and affect normal, dressed appropriately.  HEENT: Pupils equal, extraocular movements intact  Respiratory: Patient's speak in full sentences and does not appear short of breath  Cardiovascular: No lower extremity edema, non tender, no erythema  Gait normal with good balance and coordination.  MSK: Left shoulder exam has nearly full range of motion.  Very mild positive impingement and crossover test noted.  5 out of 5 strength of the rotator cuff. Scapular dyskinesis noted on the left side.  Patient does have poor posture inferior winging of the scapula noted.  Tightness noted of the trapezius.  Mild tightness of the sternocleidomastoid muscle as well noted.  Otherwise fairly good range of motion of the neck.  MSK performed of: Left shoulder This study was ordered, performed, and interpreted by [place] D.O.  Shoulder:   Supraspinatus:  Appears normal on long and transverse views, no bursal bulge seen with shoulder abduction on impingement view. Subscapularis:  Appears normal on long and transverse views. AC joint:  Capsule undistended, no geyser sign.  Very mild arthritis Glenohumeral Joint:  Appears normal without effusion. Biceps Tendon:  Appears normal on long and transverse views, no fraying of tendon, tendon located in intertubercular groove, no subluxation with shoulder internal or external rotation. No increased power doppler signal. Impression: Normal ultrasound the shoulder  Osteopathic findings C4 flexed rotated and side bent left T7 extended and rotated right    Impression and Recommendations:     The above documentation has  been reviewed and is accurate and complete Terrilee Files, DO

## 2021-04-21 ENCOUNTER — Other Ambulatory Visit: Payer: Self-pay

## 2021-04-21 ENCOUNTER — Encounter: Payer: Self-pay | Admitting: Family Medicine

## 2021-04-21 ENCOUNTER — Ambulatory Visit (INDEPENDENT_AMBULATORY_CARE_PROVIDER_SITE_OTHER): Payer: Commercial Managed Care - PPO

## 2021-04-21 ENCOUNTER — Ambulatory Visit: Payer: Self-pay

## 2021-04-21 ENCOUNTER — Ambulatory Visit (INDEPENDENT_AMBULATORY_CARE_PROVIDER_SITE_OTHER): Payer: Commercial Managed Care - PPO | Admitting: Family Medicine

## 2021-04-21 VITALS — BP 138/80 | HR 103 | Ht 71.0 in | Wt 188.0 lb

## 2021-04-21 DIAGNOSIS — M9902 Segmental and somatic dysfunction of thoracic region: Secondary | ICD-10-CM

## 2021-04-21 DIAGNOSIS — M9908 Segmental and somatic dysfunction of rib cage: Secondary | ICD-10-CM

## 2021-04-21 DIAGNOSIS — M542 Cervicalgia: Secondary | ICD-10-CM

## 2021-04-21 DIAGNOSIS — G8929 Other chronic pain: Secondary | ICD-10-CM

## 2021-04-21 DIAGNOSIS — M9901 Segmental and somatic dysfunction of cervical region: Secondary | ICD-10-CM

## 2021-04-21 DIAGNOSIS — M25512 Pain in left shoulder: Secondary | ICD-10-CM

## 2021-04-21 HISTORY — DX: Cervicalgia: M54.2

## 2021-04-21 HISTORY — DX: Segmental and somatic dysfunction of cervical region: M99.01

## 2021-04-21 MED ORDER — VENLAFAXINE HCL ER 37.5 MG PO CP24: 37.5000 mg | ORAL_CAPSULE | Freq: Every day | ORAL | 0 refills | Status: DC

## 2021-04-21 NOTE — Assessment & Plan Note (Signed)
   Decision today to treat with OMT was based on Physical Exam  After verbal consent patient was treated with HVLA, ME, FPR techniques in cervical, thoracic, rib,areas, all areas are chronic   Patient tolerated the procedure well with improvement in symptoms  Patient given exercises, stretches and lifestyle modifications  See medications in patient instructions if given  Patient will follow up in 4-8 weeks 

## 2021-04-21 NOTE — Patient Instructions (Addendum)
Good to see you Shoulder and neck xray today  Scapular exercises effexor 37.5 daily Consider having Vitamin D, TSH, ESR Uric acid labs checked See me again in 5-6 weeks   Venlafaxine Tablets What is this medicine? VENLAFAXINE (VEN la fax een) is used to treat depression, anxiety and panic disorder. This medicine may be used for other purposes; ask your health care provider or pharmacist if you have questions. COMMON BRAND NAME(S): Effexor What should I tell my health care provider before I take this medicine? They need to know if you have any of these conditions:  bleeding disorders  glaucoma  heart disease  high blood pressure  high cholesterol  kidney disease  liver disease  low levels of sodium in the blood  mania or bipolar disorder  seizures  suicidal thoughts, plans, or attempt; a previous suicide attempt by you or a family  take medicines that treat or prevent blood clots  thyroid disease  an unusual or allergic reaction to venlafaxine, desvenlafaxine, other medicines, foods, dyes, or preservatives  pregnant or trying to get pregnant  breast-feeding How should I use this medicine? Take this medicine by mouth with a glass of water. Follow the directions on the prescription label. Take it with food. Take your medicine at regular intervals. Do not take your medicine more often than directed. Do not stop taking this medicine suddenly except upon the advice of your doctor. Stopping this medicine too quickly may cause serious side effects or your condition may worsen. A special MedGuide will be given to you by the pharmacist with each prescription and refill. Be sure to read this information carefully each time. Talk to your pediatrician regarding the use of this medicine in children. Special care may be needed. Overdosage: If you think you have taken too much of this medicine contact a poison control center or emergency room at once. NOTE: This medicine is only  for you. Do not share this medicine with others. What if I miss a dose? If you miss a dose, take it as soon as you can. If it is almost time for your next dose, take only that dose. Do not take double or extra doses. What may interact with this medicine? Do not take this medicine with any of the following medications:  certain medicines for fungal infections like fluconazole, itraconazole, ketoconazole, posaconazole, voriconazole  cisapride  desvenlafaxine  dronedarone  duloxetine  levomilnacipran  linezolid  MAOIs like Carbex, Eldepryl, Marplan, Nardil, and Parnate  methylene blue (injected into a vein)  milnacipran  pimozide  thioridazine This medicine may also interact with the following medications:  amphetamines  aspirin and aspirin-like medicines  certain medicines for depression, anxiety, or psychotic disturbances  certain medicines for migraine headaches like almotriptan, eletriptan, frovatriptan, naratriptan, rizatriptan, sumatriptan, zolmitriptan  certain medicines for sleep  certain medicines that treat or prevent blood clots like dalteparin, enoxaparin, warfarin  cimetidine  clozapine  diuretics  fentanyl  furazolidone  indinavir  isoniazid  lithium  metoprolol  NSAIDS, medicines for pain and inflammation, like ibuprofen or naproxen  other medicines that prolong the QT interval (cause an abnormal heart rhythm) like dofetilide, ziprasidone  procarbazine  rasagiline  supplements like St. John's wort, kava kava, valerian  tramadol  tryptophan This list may not describe all possible interactions. Give your health care provider a list of all the medicines, herbs, non-prescription drugs, or dietary supplements you use. Also tell them if you smoke, drink alcohol, or use illegal drugs. Some items may interact  with your medicine. What should I watch for while using this medicine? Tell your doctor if your symptoms do not get better or if  they get worse. Visit your doctor or health care professional for regular checks on your progress. Because it may take several weeks to see the full effects of this medicine, it is important to continue your treatment as prescribed by your doctor. Patients and their families should watch out for new or worsening thoughts of suicide or depression. Also watch out for sudden changes in feelings such as feeling anxious, agitated, panicky, irritable, hostile, aggressive, impulsive, severely restless, overly excited and hyperactive, or not being able to sleep. If this happens, especially at the beginning of treatment or after a change in dose, call your health care professional. This medicine can cause an increase in blood pressure. Check with your doctor for instructions on monitoring your blood pressure while taking this medicine. You may get drowsy or dizzy. Do not drive, use machinery, or do anything that needs mental alertness until you know how this medicine affects you. Do not stand or sit up quickly, especially if you are an older patient. This reduces the risk of dizzy or fainting spells. Alcohol may interfere with the effect of this medicine. Avoid alcoholic drinks. Your mouth may get dry. Chewing sugarless gum, sucking hard candy and drinking plenty of water will help. Contact your doctor if the problem does not go away or is severe. What side effects may I notice from receiving this medicine? Side effects that you should report to your doctor or health care professional as soon as possible:  allergic reactions like skin rash, itching or hives, swelling of the face, lips, or tongue  anxious  breathing problems  confusion  changes in vision  chest pain  confusion  elevated mood, decreased need for sleep, racing thoughts, impulsive behavior  eye pain  fast, irregular heartbeat  feeling faint or lightheaded, falls  feeling agitated, angry, or irritable  hallucination, loss of  contact with reality  high blood pressure  loss of balance or coordination  palpitations  redness, blistering, peeling or loosening of the skin, including inside the mouth  restlessness, pacing, inability to keep still  seizures  stiff muscles  suicidal thoughts or other mood changes  trouble passing urine or change in the amount of urine  trouble sleeping  unusual bleeding or bruising  unusually weak or tired  vomiting Side effects that usually do not require medical attention (report to your doctor or health care professional if they continue or are bothersome):  change in sex drive or performance  change in appetite or weight  constipation  dizziness  dry mouth  headache  increased sweating  nausea  tired This list may not describe all possible side effects. Call your doctor for medical advice about side effects. You may report side effects to FDA at 1-800-FDA-1088. Where should I keep my medicine? Keep out of the reach of children. Store at a controlled temperature between 20 and 25 degrees C (68 and 77 degrees F), in a dry place. Throw away any unused medicine after the expiration date. NOTE: This sheet is a summary. It may not cover all possible information. If you have questions about this medicine, talk to your doctor, pharmacist, or health care provider.  2021 Elsevier/Gold Standard (2020-10-03 15:15:32)

## 2021-04-21 NOTE — Assessment & Plan Note (Signed)
Multifactorial.  Discussed with patient icing regimen and home exercises, discussed with activities to do which wants to avoid.  Patient encouraged to try potential Effexor with patient also having some underlying anxiety with being the primary caretaker for an ailing parent.  Discussed with patient about icing regimen posture and ergonomics patient given a note for an adjustable standing desk.  Patient discontinued the nortriptyline so I do think the Effexor would be fine but given a handout to watch for different types of side effects.  Patient given home exercises and work with Event organiser.  Increase activity slowly.  Follow-up again in 6 to 8 weeks

## 2021-05-13 ENCOUNTER — Other Ambulatory Visit: Payer: Self-pay | Admitting: Family Medicine

## 2021-06-01 ENCOUNTER — Other Ambulatory Visit: Payer: Self-pay | Admitting: Neurology

## 2021-06-06 NOTE — Progress Notes (Signed)
[name] [address] [place] [address] Phone: [phone] Subjective:   I Chase White am serving as a Neurosurgeon for Dr. Antoine Primas.  This visit occurred during the SARS-CoV-2 public health emergency.  Safety protocols were in place, including screening questions prior to the visit, additional usage of staff PPE, and extensive cleaning of exam room while observing appropriate contact time as indicated for disinfecting solutions.   I'm seeing this patient by the request  of:  [facility]  CC: Neck pain follow-up  [id number]  Chase White is a 40 y.o. male coming in with complaint of back, L shoulder, neck pain. OMT 04/21/2021. Patient states his shoulder has been better. Still sore. Back and neck have made improvements.  Patient states that he has a 50 to 60% improvement.  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     Reviewed prior external information including notes and imaging from previsou exam, outside providers and external EMR if available.   As well as notes that were available from care everywhere and other healthcare systems.  Past medical history, social, surgical and family history all reviewed in electronic medical record.  No pertanent information unless stated regarding to the chief complaint.   Past Medical History:  Diagnosis Date   Patient on ketogenic diet     Allergies  Allergen Reactions   Codeine      Review of Systems:  No headache, visual changes, nausea, vomiting, diarrhea, constipation, dizziness, abdominal pain, skin rash, fevers, chills, night sweats, weight loss, swollen lymph nodes, body aches, joint swelling, chest pain, shortness of breath, mood changes. POSITIVE muscle aches  Objective   Blood pressure 106/62, pulse 97, height 5\' 11"  (1.803 m), weight 182 lb (82.6 kg), SpO2 96 %.   General: No apparent distress alert and oriented x3 mood and affect normal, dressed appropriately.  HEENT: Pupils equal, extraocular movements intact  Respiratory: Patient's speak in full sentences and does not appear short of breath  Cardiovascular: No lower extremity edema, non tender, no erythema  Neck exam shows patient does have very mild loss of lordosis with tightness noted of the paraspinal musculature mostly on the left side of the neck.  Negative Spurling's.  Shoulder exam shows very mild impingement but 5 out of 5 strength of the rotator cuff.  Patient does have full range of motion noted.  Osteopathic findings  C4 flexed rotated and side bent left T4 extended rotated and side bent left inhaled rib        Assessment and Plan:  Cervical pain (neck) Multifactorial.  Still feels it is more secondary to the muscle imbalances.  Discussed with patient icing regimen and home exercises.  Discussed avoiding certain activities that I think will be beneficial.  Increase activity slowly.  Patient will follow up with me again in 6 weeks   Nonallopathic problems  Decision today to treat with OMT was based on Physical Exam  After verbal consent patient was treated with HVLA, ME, FPR techniques in cervical, rib, thoracic  areas  Patient tolerated the procedure well with improvement in symptoms  Patient given exercises, stretches and lifestyle modifications  See medications in patient instructions if given  Patient will follow up in 4-8 weeks    The above documentation has been reviewed and is accurate and complete  Judi Saa, DO        Note: This dictation was prepared with Dragon dictation along with smaller phrase technology. Any transcriptional errors that result from this process are unintentional.

## 2021-06-09 ENCOUNTER — Ambulatory Visit (INDEPENDENT_AMBULATORY_CARE_PROVIDER_SITE_OTHER): Payer: Commercial Managed Care - PPO | Admitting: Family Medicine

## 2021-06-09 ENCOUNTER — Other Ambulatory Visit: Payer: Self-pay

## 2021-06-09 ENCOUNTER — Encounter: Payer: Self-pay | Admitting: Family Medicine

## 2021-06-09 VITALS — BP 106/62 | HR 97 | Ht 71.0 in | Wt 182.0 lb

## 2021-06-09 DIAGNOSIS — M9901 Segmental and somatic dysfunction of cervical region: Secondary | ICD-10-CM | POA: Diagnosis not present

## 2021-06-09 DIAGNOSIS — M542 Cervicalgia: Secondary | ICD-10-CM

## 2021-06-09 DIAGNOSIS — M9908 Segmental and somatic dysfunction of rib cage: Secondary | ICD-10-CM | POA: Diagnosis not present

## 2021-06-09 DIAGNOSIS — M9902 Segmental and somatic dysfunction of thoracic region: Secondary | ICD-10-CM | POA: Diagnosis not present

## 2021-06-09 NOTE — Assessment & Plan Note (Addendum)
Multifactorial.  Still feels it is more secondary to the muscle imbalances.  Discussed with patient icing regimen and home exercises.  Discussed avoiding certain activities that I think will be beneficial.  Increase activity slowly.  Patient will follow up with me again in 6 weeks

## 2021-06-09 NOTE — Patient Instructions (Addendum)
Good to see you  Ice is your friend See me again in 2 months! 

## 2021-08-09 NOTE — Progress Notes (Signed)
[name] [address] [place] [address] Phone: [phone] Subjective:   INadine Counts, am serving as a scribe for Dr. Antoine Primas. This visit occurred during the SARS-CoV-2 public health emergency.  Safety protocols were in place, including screening questions prior to the visit, additional usage of staff PPE, and extensive cleaning of exam room while observing appropriate contact time as indicated for disinfecting solutions.   I'm seeing this patient by the request  of:  [facility]  CC: Low back pain and neck pain follow-up  [id number]  Arleigh Odowd is a 40 y.o. male coming in with complaint of back and neck pain. OMT 06/09/2021.  Patient states says that his shoulder is still sore, but is progressively getting better.  Patient states that has been able to increase activity slowly.  Patient is not having any significant pain restarted from anything.  Feels the over-the-counter and natural supplements in the Effexor have made significant differences at this time.  Medications patient has been prescribed: Effexor  Taking: Yes         Reviewed prior external information including notes and imaging from previsou exam, outside providers and external EMR if available.   As well as notes that were available from care everywhere and other healthcare systems.  Past medical history, social, surgical and family history all reviewed in electronic medical record.  No pertanent information unless stated regarding to the chief complaint.   Past Medical History:  Diagnosis Date   Patient on ketogenic diet     Allergies  Allergen Reactions   Codeine      Review of Systems:  No headache, visual changes, nausea, vomiting, diarrhea, constipation, dizziness, abdominal pain, skin rash, fevers, chills, night sweats, weight loss, swollen lymph nodes, body aches, joint swelling, chest pain, shortness of breath, mood changes. POSITIVE muscle  aches  Objective  Blood pressure 110/68, pulse 83, height 5\' 11"  (1.803 m), weight 177 lb (80.3 kg), SpO2 99 %.   General: No apparent distress alert and oriented x3 mood and affect normal, dressed appropriately.  HEENT: Pupils equal, extraocular movements intact  Respiratory: Patient's speak in full sentences and does not appear short of breath  Cardiovascular: No lower extremity edema, non tender, no erythema  Neck exam shows some mild loss of lordosis.  Patient does have some limited sidebending and rotation to the left.  Osteopathic findings  C6 flexed rotated and side bent left T6 extended rotated and side bent left inhaled rib L2 flexed rotated and side bent right Sacrum right on right       Assessment and Plan:  Cervical pain (neck) Stable at the moment.  Discussed icing regimen and home exercises.  Patient is doing very well.  No significant change and will continue on the low-dose of the Effexor.  Follow-up with me again in 2 to 3 months.   Nonallopathic problems  Decision today to treat with OMT was based on Physical Exam  After verbal consent patient was treated with HVLA, ME, FPR techniques in cervical, rib, thoracic areas  Patient tolerated the procedure well with improvement in symptoms  Patient given exercises, stretches and lifestyle modifications  See medications in patient instructions if given  Patient will follow up in 8-12 weeks      The above documentation has been reviewed and is accurate and complete 10-12, DO        Note: This dictation was prepared with Dragon dictation along with smaller phrase technology.  Any transcriptional errors that result from this process are unintentional.

## 2021-08-10 ENCOUNTER — Other Ambulatory Visit: Payer: Self-pay

## 2021-08-10 ENCOUNTER — Ambulatory Visit (INDEPENDENT_AMBULATORY_CARE_PROVIDER_SITE_OTHER): Payer: Commercial Managed Care - PPO | Admitting: Family Medicine

## 2021-08-10 ENCOUNTER — Encounter: Payer: Self-pay | Admitting: Family Medicine

## 2021-08-10 ENCOUNTER — Ambulatory Visit: Payer: Commercial Managed Care - PPO | Admitting: Family Medicine

## 2021-08-10 VITALS — BP 110/68 | HR 83 | Ht 71.0 in | Wt 177.0 lb

## 2021-08-10 DIAGNOSIS — M9901 Segmental and somatic dysfunction of cervical region: Secondary | ICD-10-CM

## 2021-08-10 DIAGNOSIS — M9902 Segmental and somatic dysfunction of thoracic region: Secondary | ICD-10-CM

## 2021-08-10 DIAGNOSIS — M9908 Segmental and somatic dysfunction of rib cage: Secondary | ICD-10-CM

## 2021-08-10 DIAGNOSIS — M542 Cervicalgia: Secondary | ICD-10-CM | POA: Diagnosis not present

## 2021-08-10 NOTE — Patient Instructions (Signed)
Doing wonderful Wouldn't change anything  See you again in 2-3 months

## 2021-08-10 NOTE — Assessment & Plan Note (Signed)
Stable at the moment.  Discussed icing regimen and home exercises.  Patient is doing very well.  No significant change and will continue on the low-dose of the Effexor.  Follow-up with me again in 2 to 3 months.

## 2021-08-28 ENCOUNTER — Other Ambulatory Visit: Payer: Self-pay | Admitting: Neurology

## 2021-08-28 NOTE — Telephone Encounter (Signed)
Enough given unitl 10/24/2021 no further refills until seen

## 2021-09-11 ENCOUNTER — Telehealth: Payer: Self-pay | Admitting: Neurology

## 2021-09-11 NOTE — Telephone Encounter (Signed)
Telephone call to pt , Per pt his headaches have gotten more intense and coming more frequently.  Pt taking the topirmate 25 mm three capsules daily and Rizatriptan as needed. Pt also taking Motrin couple of times a day for the last two days.    The headaches come and go they can last a couple of hours not all day. Five hours at the most.

## 2021-09-11 NOTE — Telephone Encounter (Signed)
Pt is returning a call to sheena 

## 2021-09-11 NOTE — Telephone Encounter (Signed)
Pt called in stating his migraines have been worse the past week or so. He would like to see if there is something he can do?

## 2021-09-11 NOTE — Telephone Encounter (Signed)
Tried calling pt no answer. LMOVM Dr.Jaffe note below, I would increase topiramate 100mg  at bedtime.  We can reassess if we need to make any changes at his follow up next month.  Limit use of pain relievers to no more than 2 days out of week to prevent risk of rebound or medication-overuse headache.

## 2021-09-12 MED ORDER — TOPIRAMATE 100 MG PO TABS: ORAL_TABLET | ORAL | 1 refills | Status: DC

## 2021-09-12 NOTE — Telephone Encounter (Signed)
Tried calling pt, no answer. LMOVM to call the office.

## 2021-09-12 NOTE — Telephone Encounter (Signed)
Pt called in and left a message returning Sheena's call 

## 2021-09-13 NOTE — Telephone Encounter (Signed)
Pt advised of DR.Jaffe note below.  Pt advised to increase his Topiramate to 100 mg. Script sent.

## 2021-10-06 ENCOUNTER — Other Ambulatory Visit: Payer: Self-pay | Admitting: Neurology

## 2021-10-23 NOTE — Progress Notes (Signed)
NEUROLOGY FOLLOW UP OFFICE NOTE  Chase White [id number]  Assessment/Plan:   Migraine with aura, without status migrainosus, not intractable  Migraine prevention:  topiramate 100mg  at bedtime Migraine rescue:  rizatriptan 10mg  From my standpoint, I think it is okay to discontinue venlafaxine Limit use of pain relievers to no more than 2 days out of week to prevent risk of rebound or medication-overuse headache. Keep headache diary Follow up 6 months.   Subjective:  Chase White is a 40 year old right-handed male who follows up for migraine.   UPDATE: Increased topiramate in May.   He saw Dr. 46 of Sports Medicine for neck and left shoulder pain.  X-ray showed mild degenerative changes of the glenohumeral joint.  Therapy helped.  Headaches had resolved.  But 3 weeks ago, he was sitting and then developed a twinge in his neck again and couldn't use the left shoulder for a few days.  Using a massage gun.  Increased headaches during that time but not severe.  The trapezius pain has calmed down but the shoulder joint hurts.  Since then, headaches have again resolved.  He is also wondering if he needs to remain on venlafaxine for anxiety since it was situational and the causing situation has resolved.  He actually started not taking it daily anyway.  Current NSAIDS/analgesics:  Ibuprofen 800mg  Current triptans:  Maxalt 10mg  Current ergotamine:  none Current anti-emetic:  none Current muscle relaxants:  none Current Antihypertensive medications:  none Current Antidepressant medications:  none Current Anticonvulsant medications:  topiramate 100mg  QHS Current anti-CGRP:  none Current Vitamins/Herbal/Supplements:  none Current Antihistamines/Decongestants:  none Other therapy:  none Hormone/birth control:  none   Caffeine:  Drinks on average 2 cups coffee daily.  No soda. Diet:  Drinks plenty of water.  Keto diet (over 3 years) Exercise:  not Depression:  no; Anxiety:   no Other pain:  Shoulder pain Sleep hygiene: lately restless but usually good.   HISTORY:  He had a first-time migraine over this past year.  it was severe but can't remember head location.  It was preceded by a streak of light across his vision, and associated with nausea, motion sickness, photophobia and phonophobia.  No weakness and numbness.  No known trigger.  It lasted about 4-5 hours and resolved  He had a second similar headache on 10/30 that occurred spontaneously.  Over the next week it would come and go without aura, lasting a couple of hours.  He went to [facility] on 10/03/2020 for intractable headache after being advised to be tested for COVID-19, which was negative.  He had a CT of the head on 10/18/2020 which was personally reviewed and was negative.  It continued to be daily, but without aura.  He was placed on daily Ubrelvy which helped.  Due to ongoing headaches, he went to the ED at [facility] on 10/27/2020 where he was given referral to neurology and advised to follow up with his eye doctor.  Last severe headache roughly a week later.  He still has a dull persistent left retro-orbital pressure.  He has been taking ibuprofen about once a week.  Work is all on computer so screen time aggravates it.  Since these headaches, he has been very dizzy and prone to motion sickness, such as riding in a car.  MRI of brain with and without contrast on 12/10/2020 was normal.     Past NSAIDS/analgesics:  none Past abortive triptans:  none Past abortive ergotamine:  none Past muscle relaxants:  none Past anti-emetic:  none Past antihypertensive medications:  none Past antidepressant medications:  nortriptyline (tachycardia) Past anticonvulsant medications:  none Past anti-CGRP:  Bernita Raisin Past vitamins/Herbal/Supplements:  none Past antihistamines/decongestants:  meclizine Other past therapies:  none     Family history of headache:  He thinks migraines may run in the family.  PAST  MEDICAL HISTORY: Past Medical History:  Diagnosis Date   Patient on ketogenic diet     MEDICATIONS: Current Outpatient Medications on File Prior to Visit  Medication Sig Dispense Refill   ibuprofen (ADVIL) 800 MG tablet Take 800 mg by mouth as needed.     polyethylene glycol (MIRALAX / GLYCOLAX) 17 g packet Take 17 g by mouth daily.     rizatriptan (MAXALT) 5 MG tablet Take 1 tablet (5 mg total) by mouth as needed for migraine. May repeat in 2 hours if needed 10 tablet 0   topiramate (TOPAMAX) 100 MG tablet 1 tab po qhs 30 tablet 1   venlafaxine XR (EFFEXOR-XR) 37.5 MG 24 hr capsule TAKE 1 CAPSULE BY MOUTH DAILY WITH BREAKFAST. 90 capsule 1   No current facility-administered medications on file prior to visit.    ALLERGIES: Allergies  Allergen Reactions   Codeine     FAMILY HISTORY: No family history on file.    Objective:  Blood pressure 93/67, pulse 90, height 5\' 11"  (1.803 m), weight 180 lb 6.4 oz (81.8 kg), SpO2 99 %. General: No acute distress.  Patient appears well-groomed.   Head:  Normocephalic/atraumatic Eyes:  Fundi examined but not visualized Neck: supple, no paraspinal tenderness, full range of motion Heart:  Regular rate and rhythm Lungs:  Clear to auscultation bilaterally Back: No paraspinal tenderness Neurological Exam: alert and oriented to person, place, and time.  Speech fluent and not dysarthric, language intact.  CN II-XII intact. Bulk and tone normal, muscle strength 5/5 throughout.  Sensation to light touch intact.  Deep tendon reflexes 2+ throughout, toes downgoing.  Finger to nose testing intact.  Gait normal, Romberg negative.   , DO

## 2021-10-24 ENCOUNTER — Other Ambulatory Visit: Payer: Self-pay

## 2021-10-24 ENCOUNTER — Encounter: Payer: Self-pay | Admitting: Neurology

## 2021-10-24 ENCOUNTER — Ambulatory Visit (INDEPENDENT_AMBULATORY_CARE_PROVIDER_SITE_OTHER): Payer: Commercial Managed Care - PPO | Admitting: Neurology

## 2021-10-24 VITALS — BP 93/67 | HR 90 | Ht 71.0 in | Wt 180.4 lb

## 2021-10-24 DIAGNOSIS — G43109 Migraine with aura, not intractable, without status migrainosus: Secondary | ICD-10-CM

## 2021-10-24 DIAGNOSIS — M542 Cervicalgia: Secondary | ICD-10-CM | POA: Diagnosis not present

## 2021-10-24 DIAGNOSIS — G43801 Other migraine, not intractable, with status migrainosus: Secondary | ICD-10-CM

## 2021-10-24 DIAGNOSIS — M25512 Pain in left shoulder: Secondary | ICD-10-CM | POA: Diagnosis not present

## 2021-10-24 MED ORDER — RIZATRIPTAN BENZOATE 5 MG PO TABS: 5.0000 mg | ORAL_TABLET | ORAL | 1 refills | Status: DC | PRN

## 2021-10-24 MED ORDER — TOPIRAMATE 100 MG PO TABS
ORAL_TABLET | ORAL | 1 refills | Status: DC
Start: 2021-10-24 — End: 2021-11-10

## 2021-10-24 NOTE — Patient Instructions (Signed)
Refilled topiramate and rizatriptan Follow up 6 months.

## 2021-11-05 IMAGING — DX DG SHOULDER 2+V*L*
3 series · 3 of 3 positions shown · non-contrast
Comparison: None.

CLINICAL DATA: Left shoulder pain radiating to the arm and neck for
the past 6 months.

EXAM:
LEFT SHOULDER - 2+ VIEW

[shoulder ap (1 of 2)]
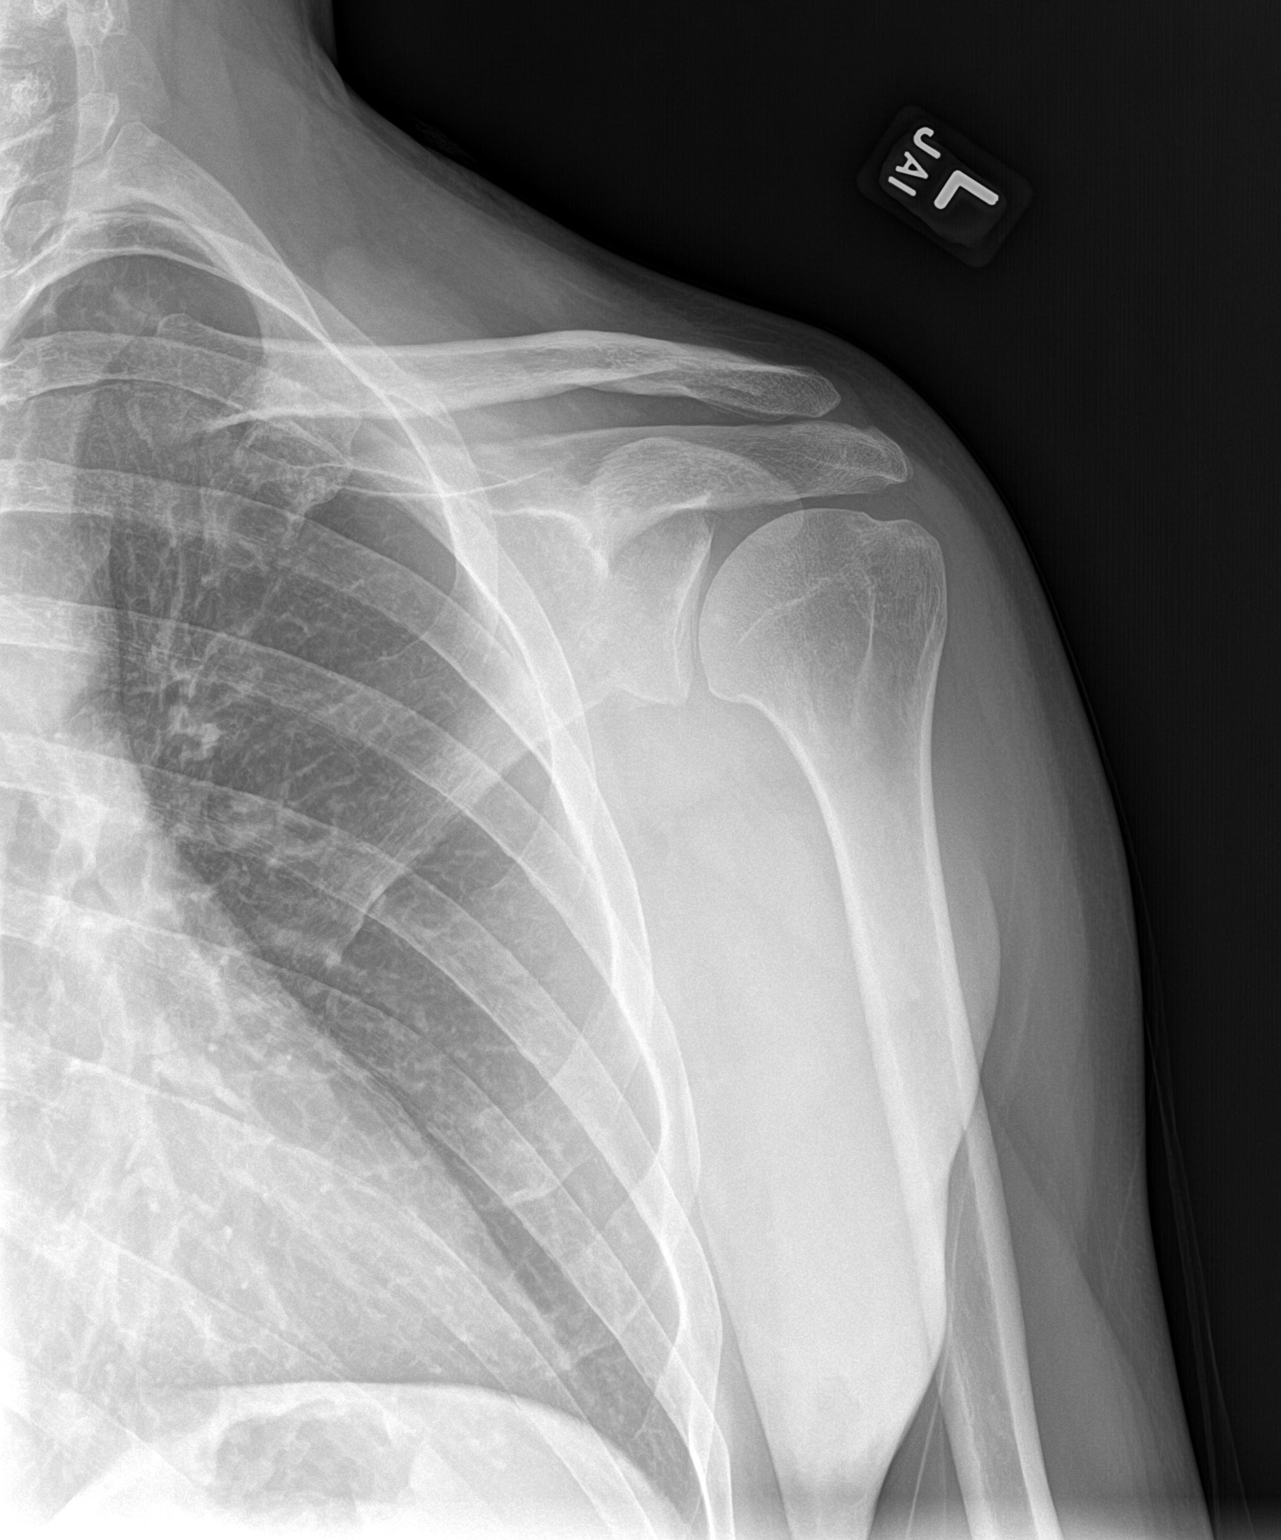

[shoulder ap (2 of 2)]
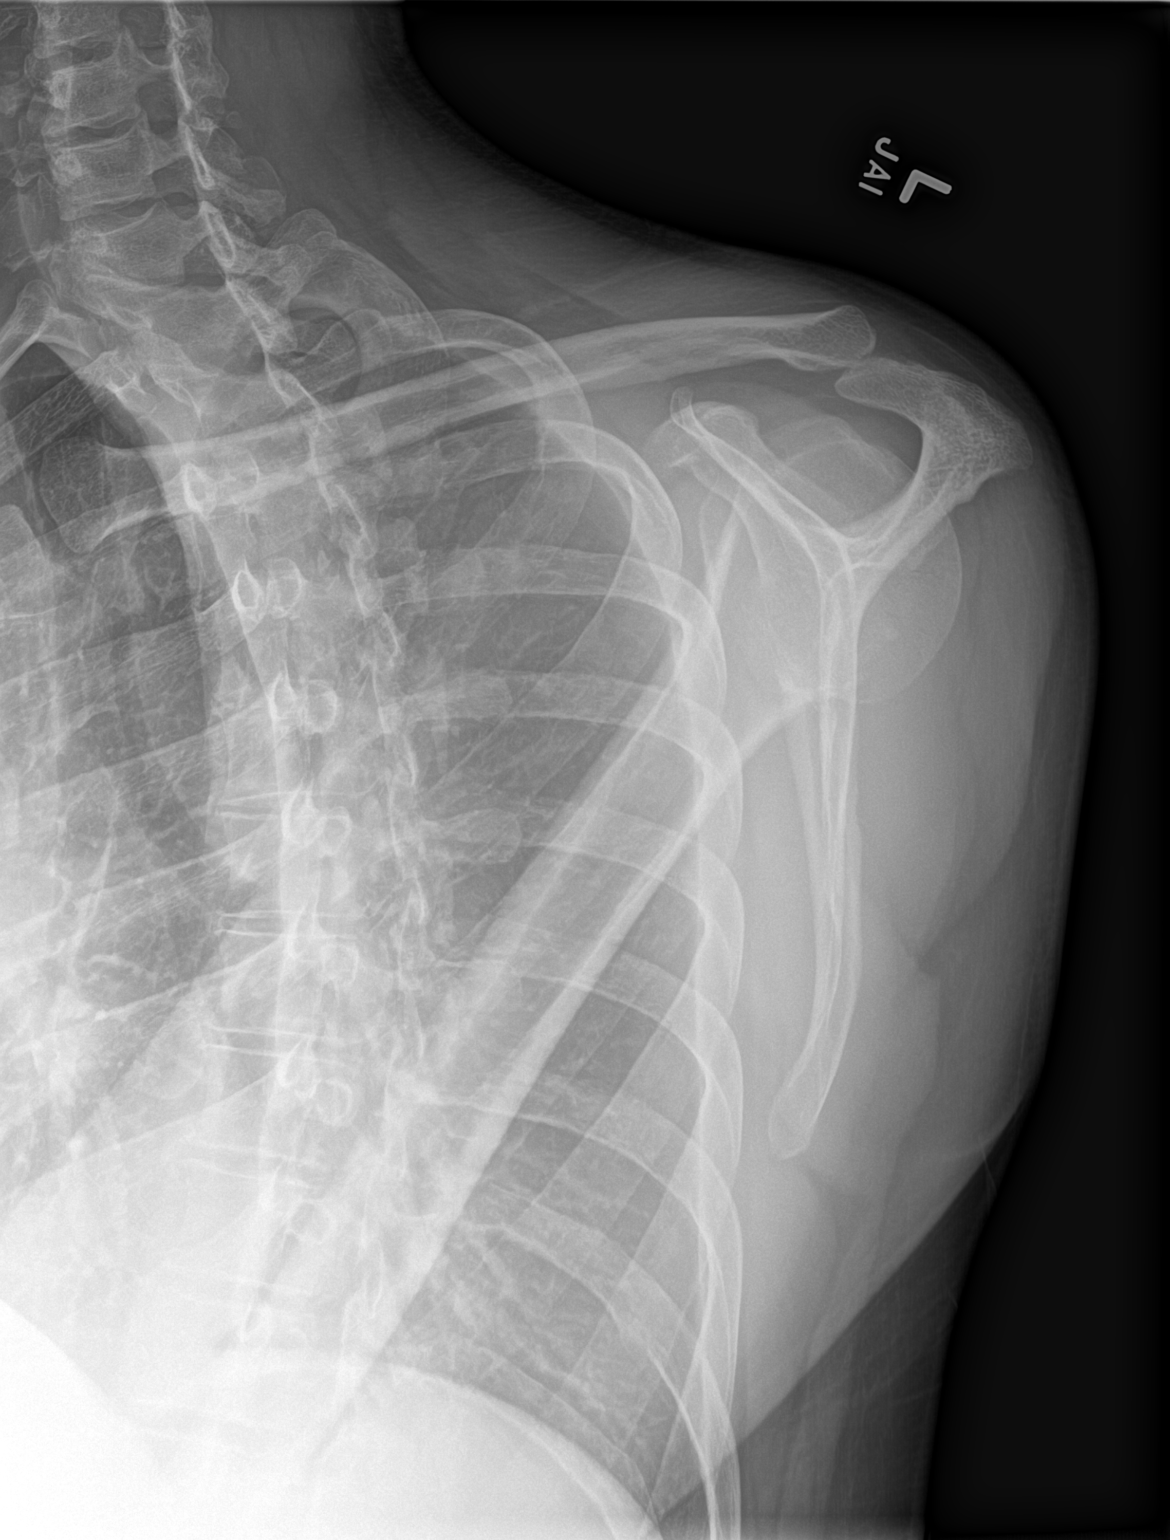

[shoulder axial]
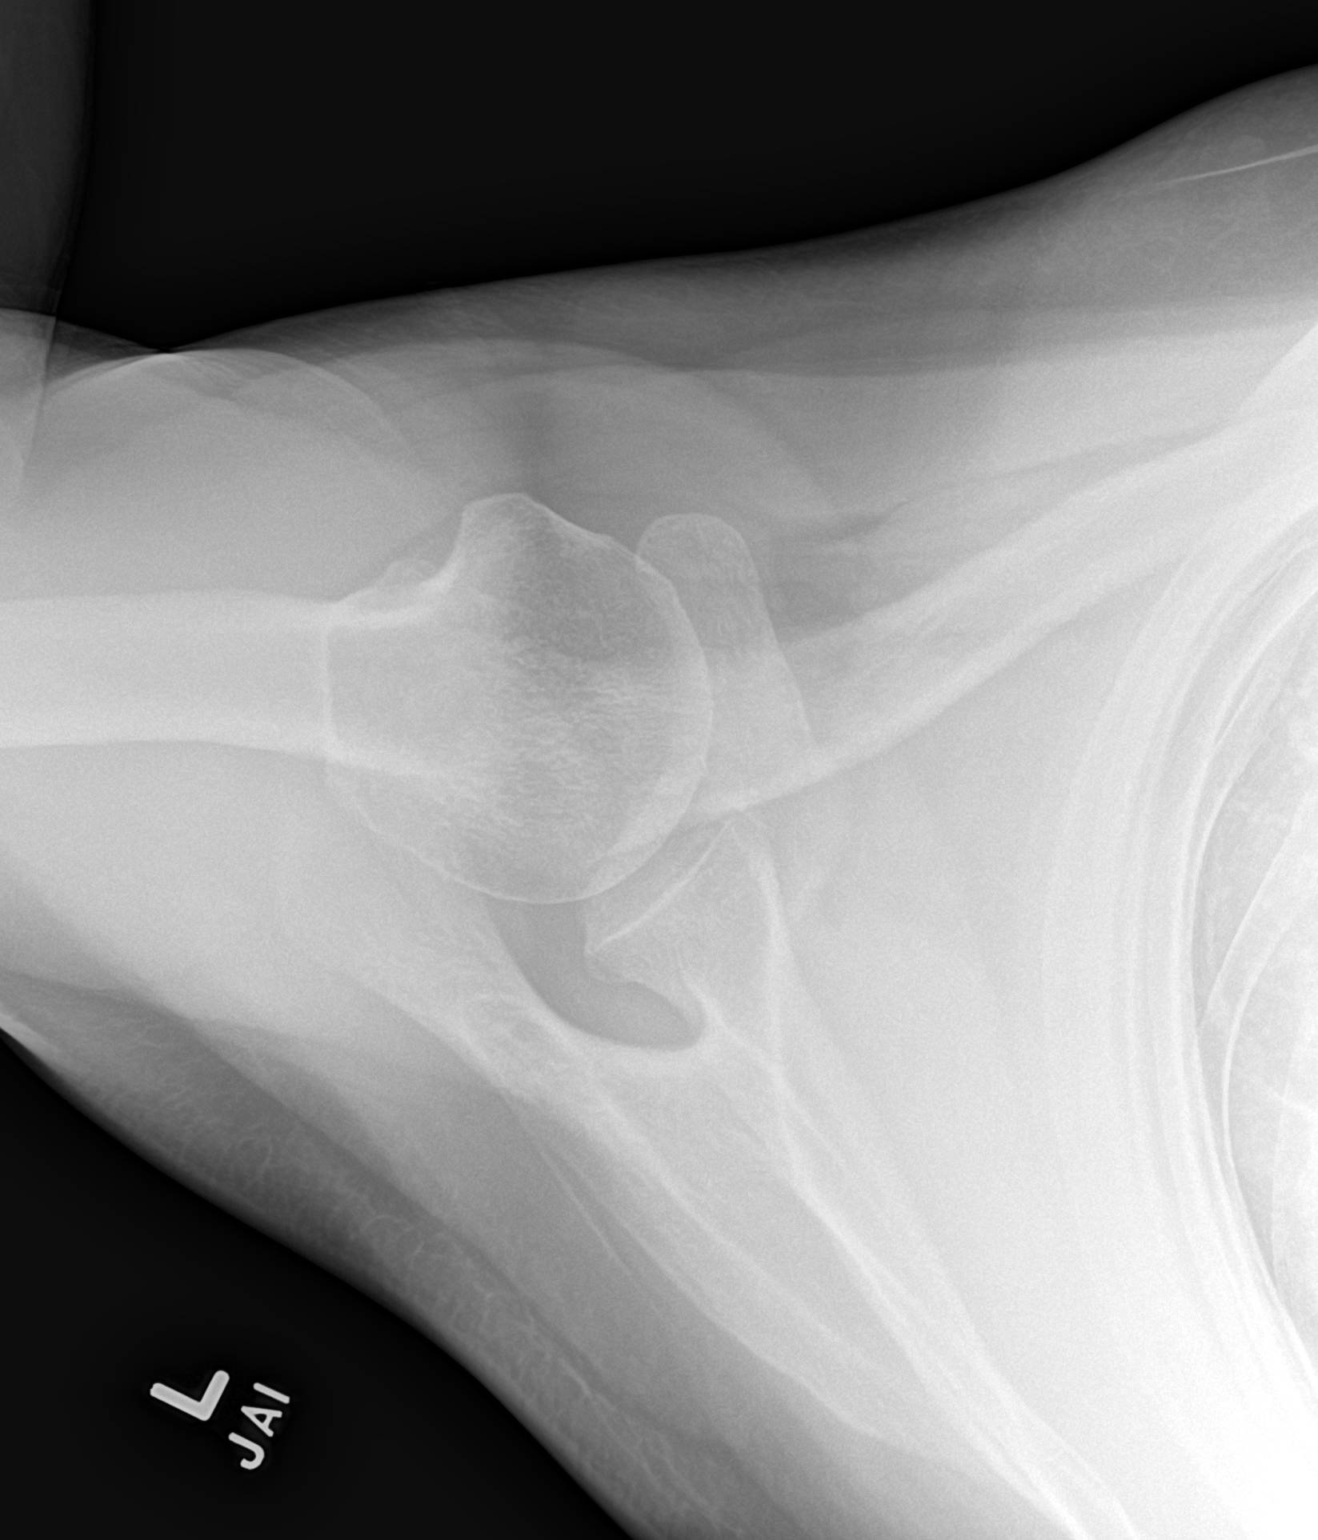

[3 of 3 positions shown; findings below may reference images not displayed]

FINDINGS: No fracture or dislocation. Mild degenerative change of the left
glenohumeral joint with joint space loss and subchondral sclerosis.
Acromioclavicular joint spaces appear preserved. No evidence of
calcific tendinitis. Limited visualization of the adjacent thorax is
normal. Regional soft tissues appear normal.
IMPRESSION: Mild degenerative change of the left glenohumeral joint.

## 2021-11-05 IMAGING — DX DG CERVICAL SPINE COMPLETE 4+V
5 series · 5 of 5 positions shown · non-contrast
Comparison: None.

CLINICAL DATA: Neck pain radiating to the left shoulder.

EXAM:
CERVICAL SPINE - COMPLETE 4+ VIEW

[c-spine lat]
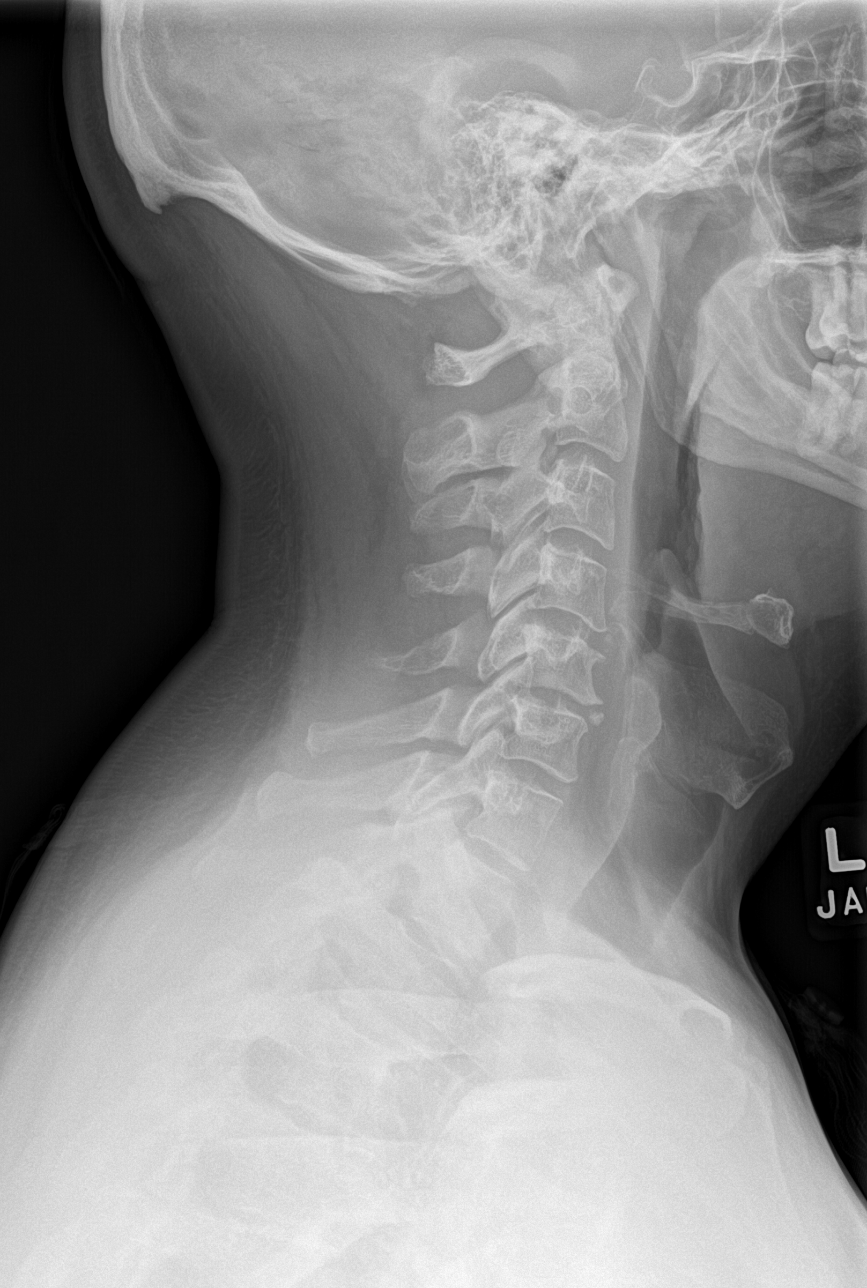

[c-spine obl (1 of 2)]
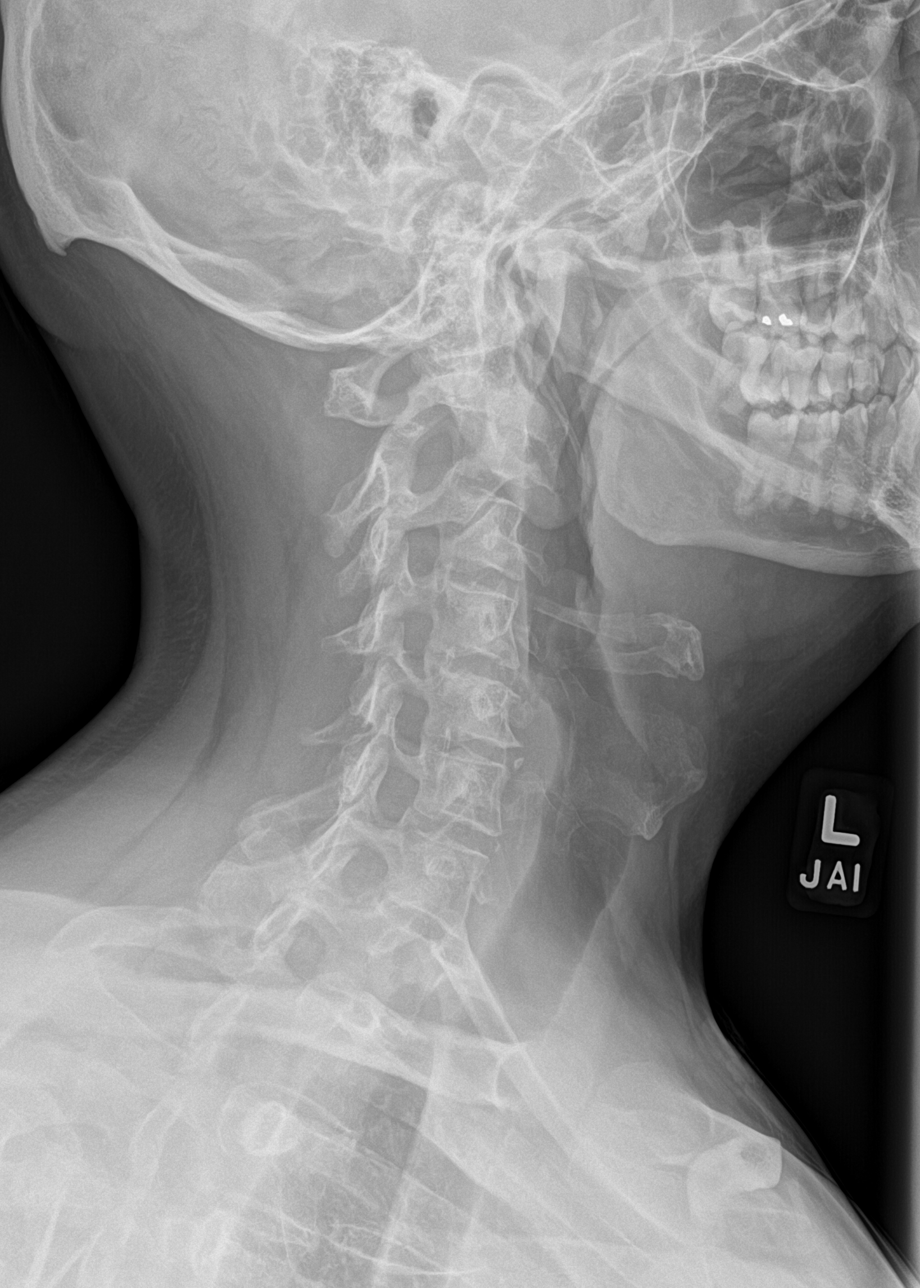

[c-spine obl (2 of 2)]
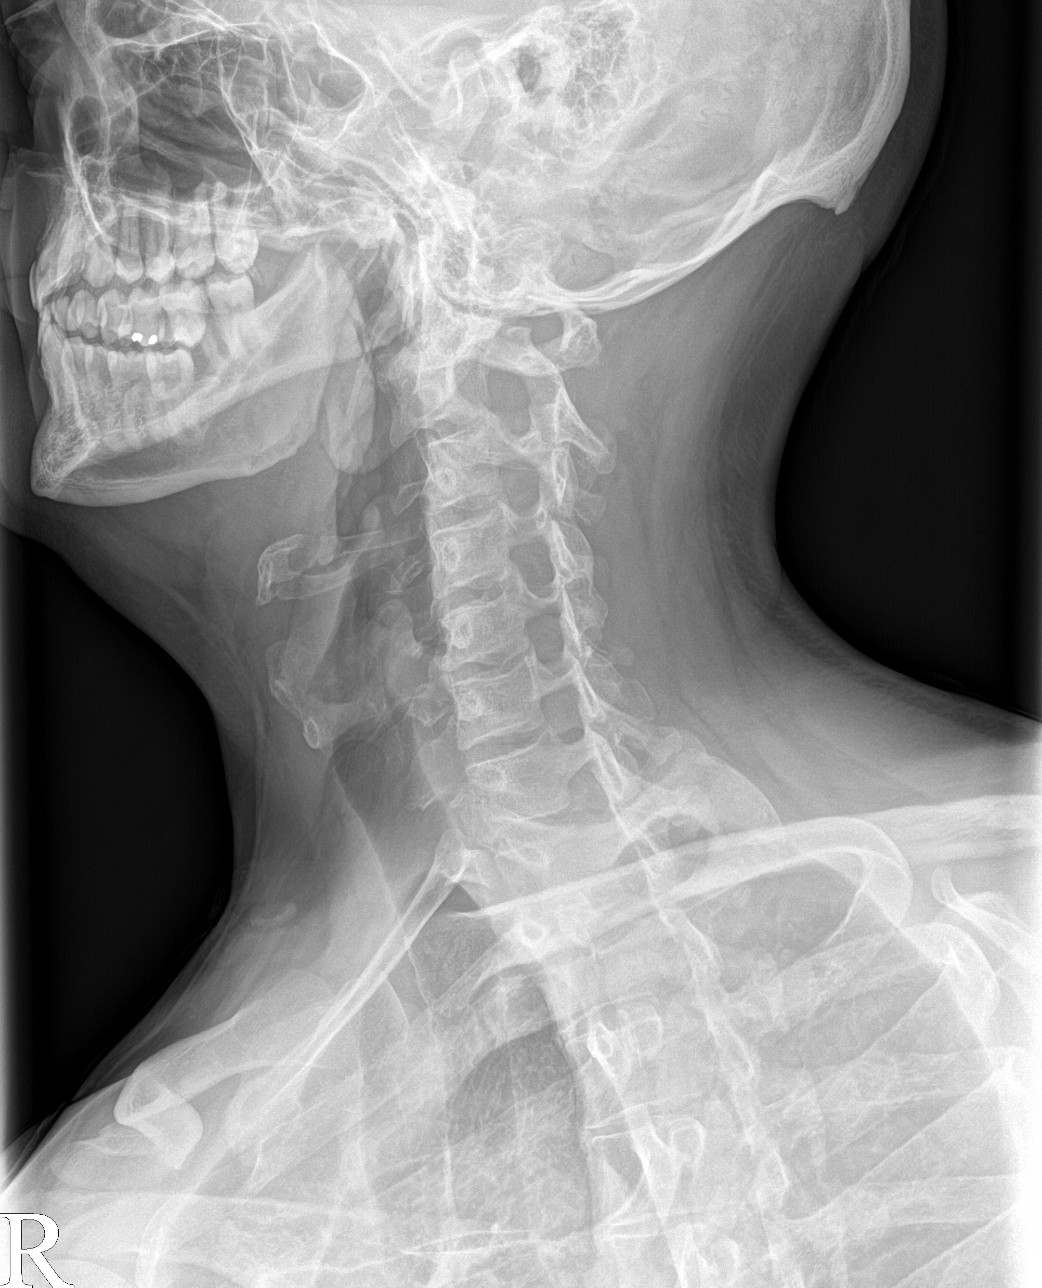

[c-spine ap]
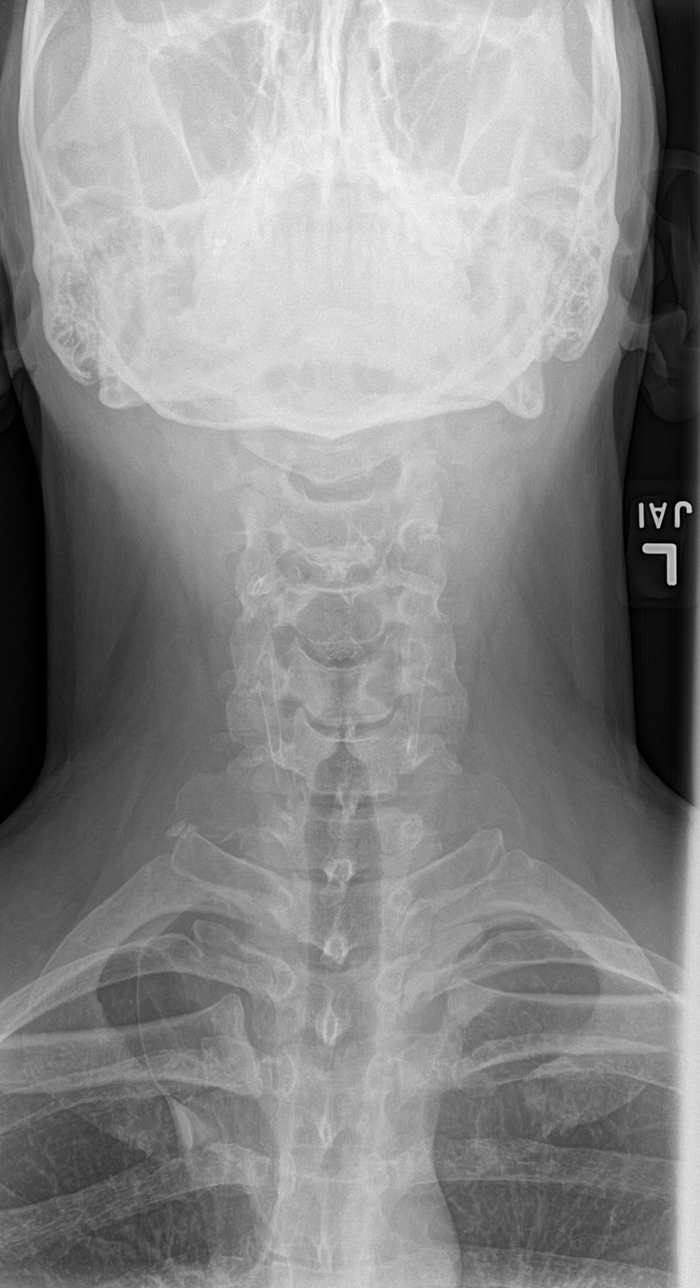

[c-spine open mouth]
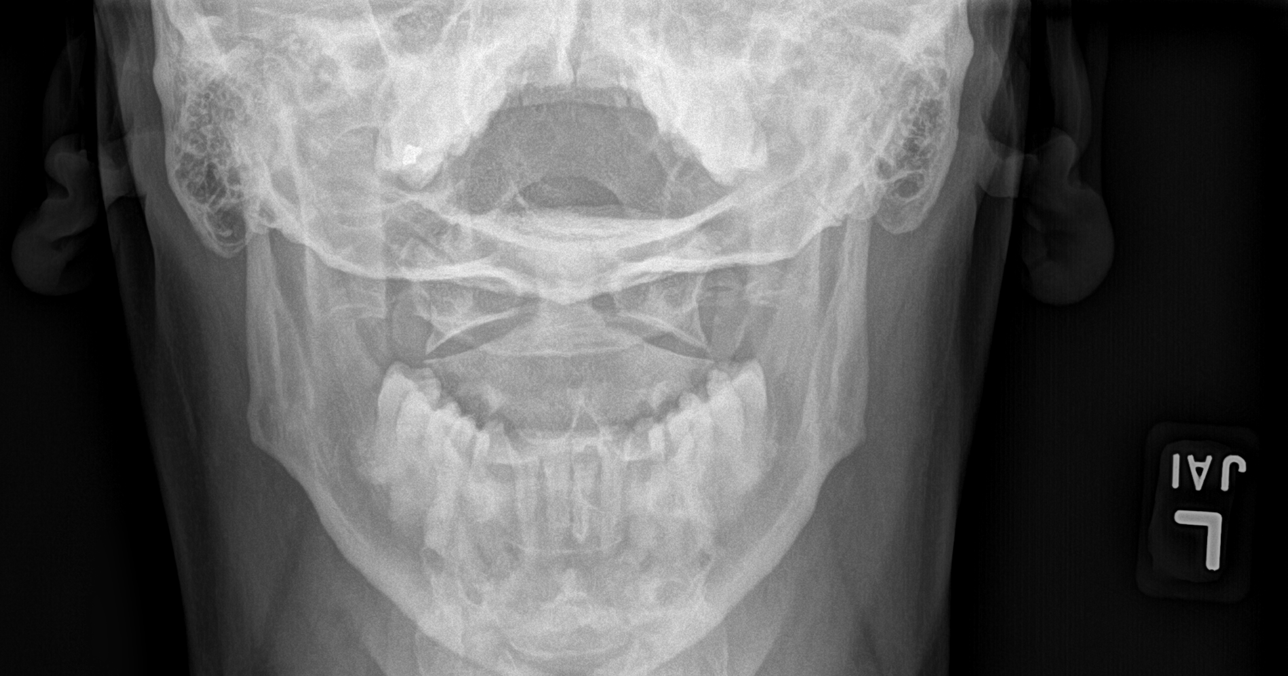

[5 of 5 positions shown; findings below may reference images not displayed]

FINDINGS: C1 to the superior endplate of T2 is imaged on the provided lateral
radiograph.

Normal alignment of the cervical spine. No anterolisthesis or
retrolisthesis. The dens is normally positioned between the lateral
masses of C1. Bilateral facets appear normally aligned.

Cervical vertebral body heights are preserved. Prevertebral soft
tissues are normal.

Mild multilevel cervical spine DDD, worse at C5-C6 and to a lesser
extent, C4-C5 with disc space height loss, endplate irregularity and
sclerosis and partial ossification involving the anterior aspect of
the intervertebral disc.

The bilateral neural foramina appear widely patent given obliquity.

Regional soft tissues appear normal. Limited visualization of the
lung apices demonstrates a right-sided azygos fissure.
IMPRESSION: Mild multilevel cervical spine DDD, worse at C5-C6.

## 2021-11-08 ENCOUNTER — Other Ambulatory Visit: Payer: Self-pay | Admitting: Neurology

## 2021-11-08 NOTE — Progress Notes (Signed)
[name] [address] [place] [address] Phone: [phone] Subjective:   Chase White, am serving as a scribe for Dr. Antoine Primas.  This visit occurred during the SARS-CoV-2 public health emergency.  Safety protocols were in place, including screening questions prior to the visit, additional usage of staff PPE, and extensive cleaning of exam room while observing appropriate contact time as indicated for disinfecting solutions.    I'm seeing this patient by the request  of:  Renaye Rakers, MD  CC: Neck pain follow-up  [id number]  Chase White is a 40 y.o. male coming in with complaint of back and neck pain. OMT 08/10/2021. Patient states that one month ago patient had setback. One month ago was sitting on couch and neck spasm that radiated into L shoulder. Pain went away over time but still notices pain in that area. Feels like it did when he first started coming in for OMT. IBU prn.   Medications patient has been prescribed: None          Reviewed prior external information including notes and imaging from previsou exam, outside providers and external EMR if available.   As well as notes that were available from care everywhere and other healthcare systems.  Past medical history, social, surgical and family history all reviewed in electronic medical record.  No pertanent information unless stated regarding to the chief complaint.   Past Medical History:  Diagnosis Date   Patient on ketogenic diet     Allergies  Allergen Reactions   Codeine      Review of Systems:  No headache, visual changes, nausea, vomiting, diarrhea, constipation, dizziness, abdominal pain, skin rash, fevers, chills, night sweats, weight loss, swollen lymph nodes, body aches, joint swelling, chest pain, shortness of breath, mood changes. POSITIVE muscle aches  Objective  Blood pressure 110/82, pulse 77, height 5\' 11"  (1.803 m), weight 176 lb (79.8  kg), SpO2 99 %.   General: No apparent distress alert and oriented x3 mood and affect normal, dressed appropriately.  HEENT: Pupils equal, extraocular movements intact  Respiratory: Patient's speak in full sentences and does not appear short of breath  Cardiovascular: No lower extremity edema, non tender, no erythema  Neck exam does have some mild loss of lordosis.  Tightness noted in the parascapular region left greater than right today though.  Patient does have some tenderness at the base of the scapula on the right side.  Osteopathic findings  C7 flexed rotated and side bent left T3 extended rotated and side bent left inhaled rib T7 extended rotated and side bent right inhaled rib L2 flexed rotated and side bent right Sacrum right on right       Assessment and Plan:  Cervical pain (neck) Chronic, with mild exacerbation.  Patient has had difficulty previously.  Patient likely had a synovitis somewhat improved though.  Patient is going to continue to work on .  We discussed ergonomics throughout the day that could be more beneficial.  Patient wants to avoid significant number of different medications.  Encouraged him to do more the exercises on a regular basis.  Follow-up with me in 6 to 8 weeks   Nonallopathic problems  Decision today to treat with OMT was based on Physical Exam  After verbal consent patient was treated with HVLA, ME, FPR techniques in cervical, rib, thoracic, l areas  Patient tolerated the procedure well with improvement in symptoms  Patient given exercises, stretches and lifestyle modifications  See medications in patient instructions if given  Patient will follow up in 4-8 weeks      The above documentation has been reviewed and is accurate and complete Judi Saa, DO        Note: This dictation was prepared with Dragon dictation along with smaller phrase technology. Any transcriptional errors that result from this  process are unintentional.

## 2021-11-09 ENCOUNTER — Ambulatory Visit (INDEPENDENT_AMBULATORY_CARE_PROVIDER_SITE_OTHER): Payer: Commercial Managed Care - PPO | Admitting: Family Medicine

## 2021-11-09 ENCOUNTER — Other Ambulatory Visit: Payer: Self-pay

## 2021-11-09 ENCOUNTER — Encounter: Payer: Self-pay | Admitting: Family Medicine

## 2021-11-09 VITALS — BP 110/82 | HR 77 | Ht 71.0 in | Wt 176.0 lb

## 2021-11-09 DIAGNOSIS — M9908 Segmental and somatic dysfunction of rib cage: Secondary | ICD-10-CM

## 2021-11-09 DIAGNOSIS — M9901 Segmental and somatic dysfunction of cervical region: Secondary | ICD-10-CM | POA: Diagnosis not present

## 2021-11-09 DIAGNOSIS — M9902 Segmental and somatic dysfunction of thoracic region: Secondary | ICD-10-CM | POA: Diagnosis not present

## 2021-11-09 DIAGNOSIS — M542 Cervicalgia: Secondary | ICD-10-CM | POA: Diagnosis not present

## 2021-11-09 NOTE — Assessment & Plan Note (Addendum)
Chronic, with mild exacerbation.  Patient has had difficulty previously.  Patient likely had a synovitis somewhat improved though.  Patient is going to continue to work on [employer].  We discussed ergonomics throughout the day that could be more beneficial.  Patient wants to avoid significant number of different medications.  Encouraged him to do more the exercises on a regular basis.  Follow-up with me in 6 to 8 weeks

## 2021-11-09 NOTE — Patient Instructions (Signed)
Great to see you Good luck with your drive Continue exercises See me in 6-8 weeks

## 2021-12-27 NOTE — Progress Notes (Signed)
°  [name] [address] [place] [address] Phone: [phone] Subjective:   Chase White, am serving as a scribe for Dr. Antoine Primas.  I'm seeing this patient by the request  of:  Renaye Rakers, MD  CC: back and neck pain   [id number]  Chase White is a 41 y.o. male coming in with complaint of back and neck pain. OMT 11/09/2021. Patient states that he got a new puppy so he has been sleeping on the couch and is a little tight in the left shoulder.   Medications patient has been prescribed: None  Taking:         Reviewed prior external information including notes and imaging from previsou exam, outside providers and external EMR if available.   As well as notes that were available from care everywhere and other healthcare systems.  Past medical history, social, surgical and family history all reviewed in electronic medical record.  No pertanent information unless stated regarding to the chief complaint.   Past Medical History:  Diagnosis Date   Patient on ketogenic diet     Allergies  Allergen Reactions   Codeine      Review of Systems:  No headache, visual changes, nausea, vomiting, diarrhea, constipation, dizziness, abdominal pain, skin rash, fevers, chills, night sweats, weight loss, swollen lymph nodes, body aches, joint swelling, chest pain, shortness of breath, mood changes. POSITIVE muscle aches  Objective  Blood pressure 110/80, pulse 80, height 5\' 11"  (1.803 m), weight 178 lb (80.7 kg), SpO2 96 %.   General: No apparent distress alert and oriented x3 mood and affect normal, dressed appropriately.  HEENT: Pupils equal, extraocular movements intact  Respiratory: Patient's speak in full sentences and does not appear short of breath  Cardiovascular: No lower extremity edema, non tender, no erythema  Neuro: Cranial nerves II through XII are intact, neurovascularly intact in all extremities with 2+   Osteopathic  findings  C3 flexed rotated and side bent left  C6 flexed rotated and side bent left T3 extended rotated and side bent left  inhaled rib T9 extended rotated and side bent left L2 flexed rotated and side bent right Sacrum right on right       Assessment and Plan: Cervical pain (neck) Neck pain noted.  Discussed with patient about the posture and ergonomics.  Seems to do well with the osteopathic manipulation.  Have discussed potentially looking at the shoulder in greater detail but does not seem to have any significant impingement.  Follow-up with me again in 6-8 weeks otherwise.    Nonallopathic problems  Decision today to treat with OMT was based on Physical Exam  After verbal consent patient was treated with HVLA, ME, FPR techniques in cervical, rib, thoracic, lumbar, and sacral  areas  Patient tolerated the procedure well with improvement in symptoms  Patient given exercises, stretches and lifestyle modifications  See medications in patient instructions if given  Patient will follow up in 4-8 weeks      The above documentation has been reviewed and is accurate and complete , DO        Note: This dictation was prepared with Dragon dictation along with smaller phrase technology. Any transcriptional errors that result from this process are unintentional.

## 2021-12-28 ENCOUNTER — Other Ambulatory Visit: Payer: Self-pay

## 2021-12-28 ENCOUNTER — Ambulatory Visit (INDEPENDENT_AMBULATORY_CARE_PROVIDER_SITE_OTHER): Payer: Commercial Managed Care - PPO | Admitting: Family Medicine

## 2021-12-28 VITALS — BP 110/80 | HR 80 | Ht 71.0 in | Wt 178.0 lb

## 2021-12-28 DIAGNOSIS — M9908 Segmental and somatic dysfunction of rib cage: Secondary | ICD-10-CM

## 2021-12-28 DIAGNOSIS — M9902 Segmental and somatic dysfunction of thoracic region: Secondary | ICD-10-CM

## 2021-12-28 DIAGNOSIS — M9904 Segmental and somatic dysfunction of sacral region: Secondary | ICD-10-CM | POA: Diagnosis not present

## 2021-12-28 DIAGNOSIS — M9903 Segmental and somatic dysfunction of lumbar region: Secondary | ICD-10-CM | POA: Diagnosis not present

## 2021-12-28 DIAGNOSIS — M542 Cervicalgia: Secondary | ICD-10-CM | POA: Diagnosis not present

## 2021-12-28 DIAGNOSIS — M9901 Segmental and somatic dysfunction of cervical region: Secondary | ICD-10-CM

## 2021-12-28 NOTE — Assessment & Plan Note (Signed)
Neck pain noted.  Discussed with patient about the posture and ergonomics.  Seems to do well with the osteopathic manipulation.  Have discussed potentially looking at the shoulder in greater detail but does not seem to have any significant impingement.  Follow-up with me again in 6-8 weeks otherwise.

## 2021-12-28 NOTE — Patient Instructions (Addendum)
Good to see you  Good luck with the puppy Keep working on the exercises when you have time Follow up in 7-8 weeks

## 2022-02-14 NOTE — Progress Notes (Signed)
?Terrilee Files D.O. ?[facility] ?[address] [place] [address] ?Phone: [phone] ?Subjective:   ?I, Wilford Grist, am serving as a scribe for Dr. Antoine Primas. ? ?This visit occurred during the SARS-CoV-2 public health emergency.  Safety protocols were in place, including screening questions prior to the visit, additional usage of staff PPE, and extensive cleaning of exam room while observing appropriate contact time as indicated for disinfecting solutions.  ?I'm seeing this patient by the request  of:  Renaye Rakers, MD ? ?CC: Neck and back pain follow-up ? ?[id number]  ?Chase White is a 41 y.o. male coming in with complaint of back and neck pain. OMT 12/28/2021. Patient states he is the same as last visit. Uses IBU prn.  Patient does feel that the osteopathic manipulation does help.  Does state that he gets approximately 2 weeks of improvement with the manipulation.  Uncomfortable but nothing that stops him from activity. ? ?Medications patient has been prescribed: None ? ?Taking: ? ? ?  ? ? ? ? ?Reviewed prior external information including notes and imaging from previsou exam, outside providers and external EMR if available.  ? ?As well as notes that were available from care everywhere and other healthcare systems. ? ?Past medical history, social, surgical and family history all reviewed in electronic medical record.  No pertanent information unless stated regarding to the chief complaint.  ? ?Past Medical History:  ?Diagnosis Date  ? Patient on ketogenic diet   ?  ?Allergies  ?Allergen Reactions  ? Codeine   ? ? ? ?Review of Systems: ? No headache, visual changes, nausea, vomiting, diarrhea, constipation, dizziness, abdominal pain, skin rash, fevers, chills, night sweats, weight loss, swollen lymph nodes, body aches, joint swelling, chest pain, shortness of breath, mood changes. POSITIVE muscle aches ? ?Objective  ?Blood pressure 100/78, pulse 91, height 5\' 11"  (1.803 m), weight 179  lb (81.2 kg), SpO2 97 %. ?  ?General: No apparent distress alert and oriented x3 mood and affect normal, dressed appropriately.  ?HEENT: Pupils equal, extraocular movements intact  ?Respiratory: Patient's speak in full sentences and does not appear short of breath  ?Cardiovascular: No lower extremity edema, non tender, no erythema  ?Left shoulder exam does have very mild scapular dyskinesis noted left greater than right.  Patient does have tightness noted with crossover.  Tightness of the neck with sidebending to the left then right, negative spurlings  ? ?Osteopathic findings ? ?C3 flexed rotated and side bent left ?C6 flexed rotated and side bent left ?T3 extended rotated and side bent left inhaled rib ?T9 extended rotated and side bent left ?L2 flexed rotated and side bent right ?Sacrum right on right ? ? ? ? ?  ?Assessment and Plan: ? ?Cervical pain (neck) ?Chronic problem but likely no significant exacerbation.  Still responding extremely well though to osteopathic manipulation.  Continues to work on the neck as well as on the scapular dyskinesis.  Patient will continue to work on posture and ergonomics and follow-up again in 6 to 8 weeks ?  ? ?Nonallopathic problems ? ?Decision today to treat with OMT was based on Physical Exam ? ?After verbal consent patient was treated with HVLA, ME, FPR techniques in cervical, rib, thoracic, lumbar, and sacral  areas ? ?Patient tolerated the procedure well with improvement in symptoms ? ?Patient given exercises, stretches and lifestyle modifications ? ?See medications in patient instructions if given ? ?Patient will follow up in 6-8 weeks ? ?  ? ?The above documentation  has been reviewed and is accurate and complete Lyndal Pulley, DO ? ? ? ?  ? ? Note: This dictation was prepared with Dragon dictation along with smaller phrase technology. Any transcriptional errors that result from this process are unintentional.    ?  ?  ? ?

## 2022-02-15 ENCOUNTER — Other Ambulatory Visit: Payer: Self-pay | Admitting: Neurology

## 2022-02-15 ENCOUNTER — Other Ambulatory Visit: Payer: Self-pay

## 2022-02-15 ENCOUNTER — Ambulatory Visit (INDEPENDENT_AMBULATORY_CARE_PROVIDER_SITE_OTHER): Payer: Commercial Managed Care - PPO | Admitting: Family Medicine

## 2022-02-15 VITALS — BP 100/78 | HR 91 | Ht 71.0 in | Wt 179.0 lb

## 2022-02-15 DIAGNOSIS — M9903 Segmental and somatic dysfunction of lumbar region: Secondary | ICD-10-CM | POA: Diagnosis not present

## 2022-02-15 DIAGNOSIS — M9901 Segmental and somatic dysfunction of cervical region: Secondary | ICD-10-CM | POA: Diagnosis not present

## 2022-02-15 DIAGNOSIS — M9904 Segmental and somatic dysfunction of sacral region: Secondary | ICD-10-CM

## 2022-02-15 DIAGNOSIS — M9908 Segmental and somatic dysfunction of rib cage: Secondary | ICD-10-CM | POA: Diagnosis not present

## 2022-02-15 DIAGNOSIS — M9902 Segmental and somatic dysfunction of thoracic region: Secondary | ICD-10-CM

## 2022-02-15 DIAGNOSIS — M542 Cervicalgia: Secondary | ICD-10-CM | POA: Diagnosis not present

## 2022-02-15 NOTE — Patient Instructions (Signed)
Keep working on scapula and shoulder ?At least 2000IU of Vit D daily ?See me in 6 weeks ?

## 2022-02-15 NOTE — Assessment & Plan Note (Signed)
Chronic problem but likely no significant exacerbation.  Still responding extremely well though to osteopathic manipulation.  Continues to work on the neck as well as on the scapular dyskinesis.  Patient will continue to work on posture and ergonomics and follow-up again in 6 to 8 weeks ?

## 2022-03-22 NOTE — Progress Notes (Signed)
?  Charlann Boxer D.O. ?[facility] ?[place] ?Phone: [phone] ?Subjective:   ?I, Chase White, am serving as a Education administrator for Dr. Hulan Saas. ?This visit occurred during the SARS-CoV-2 public health emergency.  Safety protocols were in place, including screening questions prior to the visit, additional usage of staff PPE, and extensive cleaning of exam room while observing appropriate contact time as indicated for disinfecting solutions.  ? ?I'm seeing this patient by the request  of:  Lucianne Lei, MD ? ?CC: Neck pain follow-up ? ?[id number]  ?Chase White is a 41 y.o. male coming in with complaint of back and neck pain. OMT 02/15/2022. Patient states doing a little better. No new complaints.  Patient feels like he is getting better and better, doing the exercises regularly, not using any medications for any pain relief. ? ?Medications patient has been prescribed: None ? ? ? ?  ? ? ? ? ?Reviewed prior external information including notes and imaging from previsou exam, outside providers and external EMR if available.  ? ?As well as notes that were available from care everywhere and other healthcare systems. ? ?Past medical history, social, surgical and family history all reviewed in electronic medical record.  No pertanent information unless stated regarding to the chief complaint.  ? ?Past Medical History:  ?Diagnosis Date  ? Patient on ketogenic diet   ?  ?Allergies  ?Allergen Reactions  ? Codeine   ? ? ? ?Review of Systems: ? No headache, visual changes, nausea, vomiting, diarrhea, constipation, dizziness, abdominal pain, skin rash, fevers, chills, night sweats, weight loss, swollen lymph nodes, body aches, joint swelling, chest pain, shortness of breath, mood changes. POSITIVE muscle aches ? ?Objective  ?Blood pressure 108/76, pulse 83, height 5\' 11"  (1.803 m), weight 178 lb (80.7 kg), SpO2 97 %. ?  ?General: No apparent distress alert and oriented x3 mood and  affect normal, dressed appropriately.  ?HEENT: Pupils equal, extraocular movements intact  ?Respiratory: Patient's speak in full sentences and does not appear short of breath  ?Cardiovascular: No lower extremity edema, non tender, no erythema  ?Neck exam does have some loss of lordosis.  Some tenderness to palpation minorly at the cervical thoracic area but still improvement noted.  Negative Spurling's.  5 out of 5 strength of the upper extremities. ? ?Osteopathic findings ? ?C3 flexed rotated and side bent right ?C6 flexed rotated and side bent left ?T5 extended rotated and side bent right inhaled rib ? ? ? ? ?  ?Assessment and Plan: ? ?Cervical pain (neck) ?Patient is making improvement overall.  Discussed icing regimen and home exercise, which activities to do which wants to avoid increase activity slowly.  Follow-up with me again in 12 weeks  ? ?Nonallopathic problems ? ?Decision today to treat with OMT was based on Physical Exam ? ?After verbal consent patient was treated with HVLA, ME, FPR techniques in cervical, rib, thoracic,  areas ? ?Patient tolerated the procedure well with improvement in symptoms ? ?Patient given exercises, stretches and lifestyle modifications ? ?See medications in patient instructions if given ? ?Patient will follow up in 4-8 weeks ? ?  ? ? ?The above documentation has been reviewed and is accurate and complete Lyndal Pulley, DO ? ? ? ?  ? ? Note: This dictation was prepared with Dragon dictation along with smaller phrase technology. Any transcriptional errors that result from this process are unintentional.    ?  ?  ? ?

## 2022-04-02 ENCOUNTER — Ambulatory Visit (INDEPENDENT_AMBULATORY_CARE_PROVIDER_SITE_OTHER): Payer: Commercial Managed Care - PPO | Admitting: Family Medicine

## 2022-04-02 ENCOUNTER — Encounter: Payer: Self-pay | Admitting: Family Medicine

## 2022-04-02 VITALS — BP 108/76 | HR 83 | Ht 71.0 in | Wt 178.0 lb

## 2022-04-02 DIAGNOSIS — M542 Cervicalgia: Secondary | ICD-10-CM

## 2022-04-02 DIAGNOSIS — M9908 Segmental and somatic dysfunction of rib cage: Secondary | ICD-10-CM

## 2022-04-02 DIAGNOSIS — M9901 Segmental and somatic dysfunction of cervical region: Secondary | ICD-10-CM | POA: Diagnosis not present

## 2022-04-02 DIAGNOSIS — M9902 Segmental and somatic dysfunction of thoracic region: Secondary | ICD-10-CM

## 2022-04-02 NOTE — Assessment & Plan Note (Signed)
Patient is making improvement overall.  Discussed icing regimen and home exercise, which activities to do which wants to avoid increase activity slowly.  Follow-up with me again in 12 weeks ?

## 2022-04-02 NOTE — Patient Instructions (Signed)
Good to see you! ?Keep being active ?Have a good mothers day ?See you again in 3 months ?

## 2022-05-13 ENCOUNTER — Other Ambulatory Visit: Payer: Self-pay | Admitting: Neurology

## 2022-05-31 NOTE — Progress Notes (Signed)
NEUROLOGY FOLLOW UP OFFICE NOTE  Chase White [id number]  Assessment/Plan:   Migraine with aura, without status migrainosus, not intractable   Migraine prevention:  topiramate 100mg  at bedtime Migraine rescue:  rizatriptan 10mg  Limit use of pain relievers to no more than 2 days out of week to prevent risk of rebound or medication-overuse headache. Keep headache diary Follow up 12 months.     Subjective:  Chase White is a 41 year old right-handed male who follows up for migraine.   UPDATE: Discontinued venlafaxine. May have a mild headache once in a while that is treated with ibuprofen (usually triggered by diet such as sodium).  Has not had a significant migraine in months (hasn't needed to use the rizatriptan).   Current NSAIDS/analgesics:  Ibuprofen 800mg  Current triptans:  Maxalt 10mg  Current ergotamine:  none Current anti-emetic:  none Current muscle relaxants:  none Current Antihypertensive medications:  none Current Antidepressant medications:  none Current Anticonvulsant medications:  topiramate 100mg  QHS Current anti-CGRP:  none Current Vitamins/Herbal/Supplements:  none Current Antihistamines/Decongestants:  none Other therapy:  OMM Hormone/birth control:  none   Caffeine:  Drinks on average 2 cups coffee daily.  No soda. Diet:  Drinks plenty of water.  Keto diet (over 3 years) Exercise:  not Depression:  no; Anxiety:  no Other pain:  Shoulder pain Sleep hygiene: lately restless but usually good.   HISTORY:  He had a first-time migraine over this past year.  it was severe but can't remember head location.  It was preceded by a streak of light across his vision, and associated with nausea, motion sickness, photophobia and phonophobia.  No weakness and numbness.  No known trigger.  It lasted about 4-5 hours and resolved  He had a second similar headache on 10/30 that occurred spontaneously.  Over the next week it would come and go without aura, lasting  a couple of hours.  He went to [facility] on 10/03/2020 for intractable headache after being advised to be tested for COVID-19, which was negative.  He had a CT of the head on 10/18/2020 which was personally reviewed and was negative.  It continued to be daily, but without aura.  He was placed on daily Ubrelvy which helped.  Due to ongoing headaches, he went to the ED at [facility] on 10/27/2020 where he was given referral to neurology and advised to follow up with his eye doctor.  Last severe headache roughly a week later.  He still has a dull persistent left retro-orbital pressure.  He has been taking ibuprofen about once a week.  Work is all on computer so screen time aggravates it.  Since these headaches, he has been very dizzy and prone to motion sickness, such as riding in a car.  MRI of brain with and without contrast on 12/10/2020 was normal.     Past NSAIDS/analgesics:  none Past abortive triptans:  none Past abortive ergotamine:  none Past muscle relaxants:  none Past anti-emetic:  none Past antihypertensive medications:  none Past antidepressant medications:  nortriptyline (tachycardia), venlafaxine Past anticonvulsant medications:  none Past anti-CGRP:  13/06/2020 Past vitamins/Herbal/Supplements:  none Past antihistamines/decongestants:  meclizine Other past therapies:  none     Family history of headache:  He thinks migraines may run in the family.  PAST MEDICAL HISTORY: Past Medical History:  Diagnosis Date   Patient on ketogenic diet     MEDICATIONS: Current Outpatient Medications on File Prior to Visit  Medication Sig Dispense Refill   ibuprofen (  ADVIL) 800 MG tablet Take 800 mg by mouth as needed.     polyethylene glycol (MIRALAX / GLYCOLAX) 17 g packet Take 17 g by mouth daily.     rizatriptan (MAXALT) 5 MG tablet Take 1 tablet (5 mg total) by mouth as needed for migraine. May repeat in 2 hours if needed 30 tablet 1   topiramate (TOPAMAX) 100 MG tablet TAKE 1 TABLET  BY MOUTH EVERYDAY AT BEDTIME 30 tablet 0   No current facility-administered medications on file prior to visit.    ALLERGIES: Allergies  Allergen Reactions   Codeine     FAMILY HISTORY: No family history on file.    Objective:  Blood pressure 104/71, pulse 73, height 5\' 11"  (1.803 m), weight 178 lb 6.4 oz (80.9 kg), SpO2 99 %. General: No acute distress.  Patient appears well-groomed.   Head:  Normocephalic/atraumatic Eyes:  Fundi examined but not visualized Neck: supple, no paraspinal tenderness, full range of motion, some mild left shoulder pain Heart:  Regular rate and rhythm Lungs:  Clear to auscultation bilaterally Back: No paraspinal tenderness Neurological Exam: alert and oriented to person, place, and time.  Speech fluent and not dysarthric, language intact.  CN II-XII intact. Bulk and tone normal, muscle strength 5/5 throughout.  Sensation to light touch intact.  Deep tendon reflexes 2+ throughout  Finger to nose testing intact.  Gait normal, Romberg negative.   , DO

## 2022-06-01 ENCOUNTER — Ambulatory Visit (INDEPENDENT_AMBULATORY_CARE_PROVIDER_SITE_OTHER): Payer: Commercial Managed Care - PPO | Admitting: Neurology

## 2022-06-01 ENCOUNTER — Encounter: Payer: Self-pay | Admitting: Neurology

## 2022-06-01 VITALS — BP 104/71 | HR 73 | Ht 71.0 in | Wt 178.4 lb

## 2022-06-01 DIAGNOSIS — G43801 Other migraine, not intractable, with status migrainosus: Secondary | ICD-10-CM

## 2022-06-18 ENCOUNTER — Other Ambulatory Visit: Payer: Self-pay | Admitting: Neurology

## 2022-07-03 NOTE — Progress Notes (Unsigned)
  [name] [address] [place] [address] Phone: [phone] Subjective:   INadine Counts, am serving as a scribe for Dr. Antoine Primas.  I'm seeing this patient by the request  of:  Renaye Rakers, MD  CC: Neck pain follow-up  [id number]  Chase White is a 41 y.o. male coming in with complaint of back and neck pain. OMT 04/02/2022. Patient states same per usual. No new issues.  Patient is feeling good but is a little concerned with patient going to be moving into his new house.  Medications patient has been prescribed: None  Taking:         Reviewed prior external information including notes and imaging from previsou exam, outside providers and external EMR if available.   As well as notes that were available from care everywhere and other healthcare systems.  Past medical history, social, surgical and family history all reviewed in electronic medical record.  No pertanent information unless stated regarding to the chief complaint.   Past Medical History:  Diagnosis Date   Patient on ketogenic diet     Allergies  Allergen Reactions   Codeine      Review of Systems:  No headache, visual changes, nausea, vomiting, diarrhea, constipation, dizziness, abdominal pain, skin rash, fevers, chills, night sweats, weight loss, swollen lymph nodes, body aches, joint swelling, chest pain, shortness of breath, mood changes. POSITIVE muscle aches  Objective  Blood pressure 110/74, pulse 79, height 5\' 11"  (1.803 m), weight 178 lb (80.7 kg), SpO2 97 %.   General: No apparent distress alert and oriented x3 mood and affect normal, dressed appropriately.  HEENT: Pupils equal, extraocular movements intact  Respiratory: Patient's speak in full sentences and does not appear short of breath  Cardiovascular: No lower extremity edema, non tender, no erythema  Gait MSK:  Neck exam mild loss of lordosis.  Patient does have tightness noted in the  right parascapular region.  Patient does have tightness still noted at the left side of the neck right at the cervical thoracic area.  Lacks last 5 degrees of sidebending. Osteopathic findings  C2 flexed rotated and side bent right C6 flexed rotated and side bent left T3 extended rotated and side bent right inhaled rib T6 extended rotated and side bent left       Assessment and Plan:  Cervical pain (neck) Patient is doing well overall.  Still has some tightness noted.  Nothing that stopping him from activity.  Responded extremely well to osteopathic relation.  Follow-up with me again in 2 to 3 months    Nonallopathic problems  Decision today to treat with OMT was based on Physical Exam  After verbal consent patient was treated with HVLA, ME, FPR techniques in cervical, rib, thoracic areas  Patient tolerated the procedure well with improvement in symptoms  Patient given exercises, stretches and lifestyle modifications  See medications in patient instructions if given  Patient will follow up in 8-12 weeks     The above documentation has been reviewed and is accurate and complete 10-12, DO         Note: This dictation was prepared with Dragon dictation along with smaller phrase technology. Any transcriptional errors that result from this process are unintentional.

## 2022-07-04 ENCOUNTER — Ambulatory Visit (INDEPENDENT_AMBULATORY_CARE_PROVIDER_SITE_OTHER): Payer: Commercial Managed Care - PPO | Admitting: Family Medicine

## 2022-07-04 ENCOUNTER — Encounter: Payer: Self-pay | Admitting: Family Medicine

## 2022-07-04 VITALS — BP 110/74 | HR 79 | Ht 71.0 in | Wt 178.0 lb

## 2022-07-04 DIAGNOSIS — M542 Cervicalgia: Secondary | ICD-10-CM

## 2022-07-04 DIAGNOSIS — M9902 Segmental and somatic dysfunction of thoracic region: Secondary | ICD-10-CM

## 2022-07-04 DIAGNOSIS — M9908 Segmental and somatic dysfunction of rib cage: Secondary | ICD-10-CM

## 2022-07-04 DIAGNOSIS — M9901 Segmental and somatic dysfunction of cervical region: Secondary | ICD-10-CM | POA: Diagnosis not present

## 2022-07-04 NOTE — Patient Instructions (Signed)
Congrats on house Lift with your legs

## 2022-07-04 NOTE — Assessment & Plan Note (Signed)
Patient is doing well overall.  Still has some tightness noted.  Nothing that stopping him from activity.  Responded extremely well to osteopathic relation.  Follow-up with me again in 2 to 3 months

## 2022-08-21 NOTE — Progress Notes (Signed)
  [facility] [address] [place] [address] Phone: [phone] Subjective:   Chase White, am serving as a scribe for Dr. Antoine Primas.  I'm seeing this patient by the request  of:  Renaye Rakers, MD  CC: Neck and back pain, left  [id number]  Chase White is a 41 y.o. male coming in with complaint of back and neck pain. OMT 07/04/2022. Patient states doing well. Did tweak neck recently, but feels like recovery is getting quicker. No new issues.  Medications patient has been prescribed: None  Taking:         Reviewed prior external information including notes and imaging from previsou exam, outside providers and external EMR if available.   As well as notes that were available from care everywhere and other healthcare systems.  Past medical history, social, surgical and family history all reviewed in electronic medical record.  No pertanent information unless stated regarding to the chief complaint.   Past Medical History:  Diagnosis Date   Patient on ketogenic diet     Allergies  Allergen Reactions   Codeine      Review of Systems:  No headache, visual changes, nausea, vomiting, diarrhea, constipation, dizziness, abdominal pain, skin rash, fevers, chills, night sweats, weight loss, swollen lymph nodes, body aches, joint swelling, chest pain, shortness of breath, mood changes. POSITIVE muscle aches  Objective  Blood pressure 116/74, pulse 81, height 5\' 11"  (1.803 m), weight 178 lb (80.7 kg), SpO2 98 %.   General: No apparent distress alert and oriented x3 mood and affect normal, dressed appropriately.  HEENT: Pupils equal, extraocular movements intact  Respiratory: Patient's speak in full sentences and does not appear short of breath  Cardiovascular: No lower extremity edema, non tender, no erythema  Neck exam does have some loss of lordosis.  Some tenderness to palpation noted.  Osteopathic findings  C3 flexed rotated  and side bent right C6 flexed rotated and side bent left T3 extended rotated and side bent left inhaled rib T9 extended rotated and side bent right L2 flexed rotated and side bent right L4 flexed rotated and side bent left Sacrum right on right     Assessment and Plan:  Cervical pain (neck) Cervical neck pain noted.  Discussed icing regimen and home exercises.  Patient has done well.  With only some mild discomfort since patient has been moving and had a little more lower back aches strength pain.  Patient with no radicular symptoms.  Not taking anything on a regular basis.  Follow-up with me again in 2 to 3 months    Nonallopathic problems  Decision today to treat with OMT was based on Physical Exam  After verbal consent patient was treated with HVLA, ME, FPR techniques in cervical, rib, thoracic, lumbar, and sacral  areas  Patient tolerated the procedure well with improvement in symptoms  Patient given exercises, stretches and lifestyle modifications  See medications in patient instructions if given  Patient will follow up in 4-8 weeks    The above documentation has been reviewed and is accurate and complete Judi Saa, DO          Note: This dictation was prepared with Dragon dictation along with smaller phrase technology. Any transcriptional errors that result from this process are unintentional.

## 2022-08-22 ENCOUNTER — Ambulatory Visit (INDEPENDENT_AMBULATORY_CARE_PROVIDER_SITE_OTHER): Payer: Commercial Managed Care - PPO | Admitting: Family Medicine

## 2022-08-22 VITALS — BP 116/74 | HR 81 | Ht 71.0 in | Wt 178.0 lb

## 2022-08-22 DIAGNOSIS — M9903 Segmental and somatic dysfunction of lumbar region: Secondary | ICD-10-CM

## 2022-08-22 DIAGNOSIS — M9908 Segmental and somatic dysfunction of rib cage: Secondary | ICD-10-CM | POA: Diagnosis not present

## 2022-08-22 DIAGNOSIS — M542 Cervicalgia: Secondary | ICD-10-CM | POA: Diagnosis not present

## 2022-08-22 DIAGNOSIS — M9901 Segmental and somatic dysfunction of cervical region: Secondary | ICD-10-CM

## 2022-08-22 DIAGNOSIS — M9902 Segmental and somatic dysfunction of thoracic region: Secondary | ICD-10-CM

## 2022-08-22 DIAGNOSIS — M9904 Segmental and somatic dysfunction of sacral region: Secondary | ICD-10-CM | POA: Diagnosis not present

## 2022-08-22 NOTE — Patient Instructions (Signed)
Glad the move went well Do prescribed exercises at least 3x a week

## 2022-08-22 NOTE — Assessment & Plan Note (Signed)
Cervical neck pain noted.  Discussed icing regimen and home exercises.  Patient has done well.  With only some mild discomfort since patient has been moving and had a little more lower back aches strength pain.  Patient with no radicular symptoms.  Not taking anything on a regular basis.  Follow-up with me again in 2 to 3 months

## 2022-09-06 ENCOUNTER — Ambulatory Visit
Admission: RE | Admit: 2022-09-06 | Discharge: 2022-09-06 | Disposition: A | Discharge status: Home / Self Care | Payer: Commercial Managed Care - PPO | Source: Ambulatory Visit | Attending: Emergency Medicine | Admitting: Emergency Medicine

## 2022-09-06 VITALS — BP 106/72 | HR 85 | Temp 98.2°F | Resp 18 | Ht 71.0 in | Wt 172.0 lb

## 2022-09-06 DIAGNOSIS — L237 Allergic contact dermatitis due to plants, except food: Secondary | ICD-10-CM | POA: Diagnosis not present

## 2022-09-06 MED ORDER — DEXAMETHASONE SODIUM PHOSPHATE 10 MG/ML IJ SOLN
10.0000 mg | Freq: Once | INTRAMUSCULAR | Status: AC
  Administered 2022-09-06: 10 mg via INTRAMUSCULAR

## 2022-09-06 MED ORDER — PREDNISONE 10 MG (21) PO TBPK: ORAL_TABLET | Freq: Every day | ORAL | 0 refills | Status: DC

## 2022-09-06 MED ORDER — DEXAMETHASONE SODIUM PHOSPHATE 10 MG/ML IJ SOLN: 10.0000 mg | Freq: Once | INTRAMUSCULAR | Status: DC

## 2022-09-06 NOTE — ED Provider Notes (Addendum)
[facility] White    CSN: [id number] Arrival date & time: 09/06/22  0931      History   Chief Complaint Chief Complaint  Patient presents with   Poison Ivy    Rash with blisters/swelling on arms for about a week - Entered by patient    HPI Chase White is a 41 y.o. male.  Patient presents with 1 week history of pruritic rash on his forearms after coming into contact with poison ivy.  The rash is spreading.  Treatment attempted at home with calamine lotion.  Patient also takes Allegra daily for allergies.  He denies fever, chills, sore throat, cough, shortness of breath, or other symptoms.  No pertinent medical history.  The history is provided by the patient and medical records.    Past Medical History:  Diagnosis Date   Patient on ketogenic diet     Patient Active Problem List   Diagnosis Date Noted   Cervical pain (neck) 04/21/2021   Somatic dysfunction of spine, cervical 04/21/2021    Past Surgical History:  Procedure Laterality Date   APPENDECTOMY     TONSILLECTOMY     VASECTOMY         Home Medications    Prior to Admission medications   Medication Sig Start Date End Date Taking? Authorizing Provider  predniSONE (STERAPRED UNI-PAK 21 TAB) 10 MG (21) TBPK tablet Take by mouth daily. As directed 09/07/22  Yes Mickie Bail, NP  cholecalciferol (VITAMIN D3) 25 MCG (1000 UNIT) tablet Take 1,000 Units by mouth daily.    [provider]  ibuprofen (ADVIL) 800 MG tablet Take 800 mg by mouth as needed. 10/10/20   [provider]  polyethylene glycol (MIRALAX / GLYCOLAX) 17 g packet Take 17 g by mouth daily.    [provider]  rizatriptan (MAXALT) 5 MG tablet Take 1 tablet (5 mg total) by mouth as needed for migraine. May repeat in 2 hours if needed 10/24/21   Drema Dallas, DO  Tart Cherry 1200 MG CAPS Take by mouth.    [provider]  topiramate (TOPAMAX) 100 MG tablet TAKE 1 TABLET BY MOUTH EVERYDAY AT BEDTIME 06/18/22    Drema Dallas, DO    Family History History reviewed. No pertinent family history.  Social History Social History   Tobacco Use   Smoking status: Former   Smokeless tobacco: Never  Building services engineer Use: Never used  Substance Use Topics   Alcohol use: Yes    Alcohol/week: 2.0 - 3.0 standard drinks of alcohol    Types: 2 - 3 Standard drinks or equivalent per week    Comment: occ.   Drug use: Never     Allergies   Codeine   Review of Systems Review of Systems  Constitutional:  Negative for chills and fever.  HENT:  Negative for ear pain and sore throat.   Respiratory:  Negative for cough and shortness of breath.   Cardiovascular:  Negative for chest pain and palpitations.  Skin:  Positive for rash.  All other systems reviewed and are negative.    Physical Exam Triage Vital Signs ED Triage Vitals  Enc Vitals Group     BP      Pulse      Resp      Temp      Temp src      SpO2      Weight      Height      Head  Circumference      Peak Flow      Pain Score      Pain Loc      Pain Edu?      Excl. in GC?    No data found.  Updated Vital Signs BP 106/72   Pulse 85   Temp 98.2 F (36.8 C)   Resp 18   Ht 5\' 11"  (1.803 m)   Wt 172 lb (78 kg)   SpO2 98%   BMI 23.99 kg/m   Visual Acuity Right Eye Distance:   Left Eye Distance:   Bilateral Distance:    Right Eye Near:   Left Eye Near:    Bilateral Near:     Physical Exam Vitals and nursing note reviewed.  Constitutional:      General: He is not in acute distress.    Appearance: Normal appearance. He is well-developed. He is not ill-appearing.  HENT:     Mouth/Throat:     Mouth: Mucous membranes are moist.     Pharynx: Oropharynx is clear.  Cardiovascular:     Rate and Rhythm: Normal rate and regular rhythm.     Heart sounds: Normal heart sounds.  Pulmonary:     Effort: Pulmonary effort is normal. No respiratory distress.     Breath sounds: Normal breath sounds.  Musculoskeletal:      Cervical back: Neck supple.  Skin:    General: Skin is warm and dry.     Findings: Rash present.     Comments: Erythematous papular and vesicular rash in patches on bilateral forearms, L>R.   Neurological:     Mental Status: He is alert.  Psychiatric:        Mood and Affect: Mood normal.        Behavior: Behavior normal.      UC Treatments / Results  Labs (all labs ordered are listed, but only abnormal results are displayed) Labs Reviewed - No data to display  EKG   Radiology No results found.  Procedures Procedures (including critical care time)  Medications Ordered in UC Medications  dexamethasone (DECADRON) injection 10 mg (has no administration in time range)    Initial Impression / Assessment and Plan / UC Course  I have reviewed the triage vital signs and the nursing notes.  Pertinent labs & imaging results that were available during my care of the patient were reviewed by me and considered in my medical decision making (see chart for details).   Poison ivy dermatitis.  Dexamethasone injection given here.  Starting prednisone taper tomorrow.  Discussed antihistamine such as Benadryl or Zyrtec.  Discussed other symptomatic treatment including calamine lotion.  Instructed patient to follow up with his PCP if his symptoms are not improving.  Education provided on poison ivy dermatitis.  He agrees to plan of care.     Final Clinical Impressions(s) / UC Diagnoses   Final diagnoses:  Poison ivy dermatitis     Discharge Instructions      You were given an injection of a steroid called dexamethasone  Start the prednisone taper tomorrow as directed.    Take Benadryl or Zyrtec as directed.    Follow up with your primary care provider.       ED Prescriptions     Medication Sig Dispense Auth. Provider   predniSONE (STERAPRED UNI-PAK 21 TAB) 10 MG (21) TBPK tablet Take by mouth daily. As directed 21 tablet , NP      PDMP not reviewed this  encounter.   Sharion Balloon, NP 09/06/22 [place], Marissa, NP 09/06/22 [phone]

## 2022-09-06 NOTE — ED Triage Notes (Signed)
Patient to Urgent Care with complaints of rashes present to bilateral arms x1 week, believed to be poison ivy.

## 2022-09-06 NOTE — Discharge Instructions (Addendum)
You were given an injection of a steroid called dexamethasone.  Start the prednisone taper tomorrow as directed.    Take Benadryl or Zyrtec as directed.    Follow up with your primary care provider.   

## 2022-10-02 ENCOUNTER — Other Ambulatory Visit: Payer: Self-pay | Admitting: Neurology

## 2022-10-30 ENCOUNTER — Ambulatory Visit
Admission: RE | Admit: 2022-10-30 | Discharge: 2022-10-30 | Disposition: A | Discharge status: Home / Self Care | Payer: Commercial Managed Care - PPO | Source: Ambulatory Visit

## 2022-10-30 VITALS — BP 106/76 | HR 96 | Temp 99.8°F | Resp 18

## 2022-10-30 DIAGNOSIS — J209 Acute bronchitis, unspecified: Secondary | ICD-10-CM

## 2022-10-30 MED ORDER — PREDNISONE 20 MG PO TABS: ORAL_TABLET | ORAL | 0 refills | Status: AC

## 2022-10-30 MED ORDER — HYDROCOD POLI-CHLORPHE POLI ER 10-8 MG/5ML PO SUER: 5.0000 mL | Freq: Two times a day (BID) | ORAL | 0 refills | Status: AC | PRN

## 2022-10-30 MED ORDER — BENZONATATE 100 MG PO CAPS: ORAL_CAPSULE | ORAL | 0 refills | Status: DC

## 2022-10-30 NOTE — ED Provider Notes (Addendum)
[facility]    CSN: [id number] Arrival date & time: 10/30/22  1517      History   Chief Complaint Chief Complaint  Patient presents with   Cough    Sick with cough since 11/21. Did telehealth visit that Saturday. They prescribed Flonase and benzonotate, but the cough has not let up. - Entered by patient    HPI Chase White is a 41 y.o. male.    Cough   Presents to urgent care with complaint of nonproductive cough since November 21.  Patient was seen via telehealth 2 weeks ago and given treatment but states cough has not yet resolved.  Patient states he was prescribed benzonatate and Flonase which had some benefits for his upper respiratory symptoms and calming of the cough.  Presents today with dry cough, nonproductive.  Denies fever.  Past Medical History:  Diagnosis Date   Patient on ketogenic diet     Patient Active Problem List   Diagnosis Date Noted   Cervical pain (neck) 04/21/2021   Somatic dysfunction of spine, cervical 04/21/2021    Past Surgical History:  Procedure Laterality Date   APPENDECTOMY     TONSILLECTOMY     VASECTOMY         Home Medications    Prior to Admission medications   Medication Sig Start Date End Date Taking? Authorizing Provider  benzonatate (TESSALON) 200 MG capsule Take 200 mg by mouth 3 (three) times daily as needed. 10/20/22  Yes [provider]  Cobalamin Combinations (NEURIVA PLUS) CAPS  01/24/21  Yes [provider]  cyclobenzaprine (FLEXERIL) 10 MG tablet Take by mouth. 10/14/18  Yes [provider]  fluticasone (FLONASE) 50 MCG/ACT nasal spray Place 1 spray into both nostrils 2 (two) times daily. 10/20/22  Yes [provider]  cholecalciferol (VITAMIN D3) 25 MCG (1000 UNIT) tablet Take 1,000 Units by mouth daily.    [provider]  ibuprofen (ADVIL) 800 MG tablet Take 800 mg by mouth as needed. 10/10/20   [provider]  polyethylene glycol  (MIRALAX / GLYCOLAX) 17 g packet Take 17 g by mouth daily.    [provider]  predniSONE (STERAPRED UNI-PAK 21 TAB) 10 MG (21) TBPK tablet Take by mouth daily. As directed 09/07/22   Mickie Bail, NP  rizatriptan (MAXALT) 5 MG tablet Take 1 tablet (5 mg total) by mouth as needed for migraine. May repeat in 2 hours if needed 10/24/21   Drema Dallas, DO  Tart Cherry 1200 MG CAPS Take by mouth.    [provider]  topiramate (TOPAMAX) 100 MG tablet TAKE 1 TABLET BY MOUTH EVERYDAY AT BEDTIME 10/02/22   Drema Dallas, DO    Family History History reviewed. No pertinent family history.  Social History Social History   Tobacco Use   Smoking status: Former   Smokeless tobacco: Never  Building services engineer Use: Never used  Substance Use Topics   Alcohol use: Yes    Alcohol/week: 2.0 - 3.0 standard drinks of alcohol    Types: 2 - 3 Standard drinks or equivalent per week    Comment: occ.   Drug use: Never     Allergies   Codeine   Review of Systems Review of Systems  Respiratory:  Positive for cough.      Physical Exam Triage Vital Signs ED Triage Vitals [10/30/22 1532]  Enc Vitals Group     BP 106/76     Pulse Rate 96  Resp 18     Temp 99.8 F (37.7 C)     Temp src      SpO2 96 %     Weight      Height      Head Circumference      Peak Flow      Pain Score 0     Pain Loc      Pain Edu?      Excl. in GC?    No data found.  Updated Vital Signs BP 106/76   Pulse 96   Temp 99.8 F (37.7 C)   Resp 18   SpO2 96%   Visual Acuity Right Eye Distance:   Left Eye Distance:   Bilateral Distance:    Right Eye Near:   Left Eye Near:    Bilateral Near:     Physical Exam Vitals reviewed.  Constitutional:      Appearance: Normal appearance. He is not ill-appearing.  Cardiovascular:     Rate and Rhythm: Normal rate and regular rhythm.     Pulses: Normal pulses.     Heart sounds: Normal heart sounds.  Pulmonary:     Effort: Pulmonary effort  is normal.     Breath sounds: Normal breath sounds. No wheezing or rhonchi.  Skin:    General: Skin is warm and dry.  Neurological:     General: No focal deficit present.     Mental Status: He is alert and oriented to person, place, and time.  Psychiatric:        Mood and Affect: Mood normal.        Behavior: Behavior normal.      UC Treatments / Results  Labs (all labs ordered are listed, but only abnormal results are displayed) Labs Reviewed - No data to display  EKG   Radiology No results found.  Procedures Procedures (including critical care time)  Medications Ordered in UC Medications - No data to display  Initial Impression / Assessment and Plan / UC Course  I have reviewed the triage vital signs and the nursing notes.  Pertinent labs & imaging results that were available during my care of the patient were reviewed by me and considered in my medical decision making (see chart for details).   Patient is afebrile here without recent antipyretics. Satting well on room air. Overall is well appearing, well hydrated, without respiratory distress. Pulmonary exam is unremarkable other than presence of moderate dry cough.  Suspect bronchitis and will prescribe a course of prednisone.  Recommended patient wait until starting medication in the morning to avoid sleep disturbance.  Will also represcribe benzonatate for daytime use as well as Tussionex.  Patient states he does not tolerate codeine but believes he does tolerate hydrocodone.    Final Clinical Impressions(s) / UC Diagnoses   Final diagnoses:  None   Discharge Instructions   None    ED Prescriptions   None    PDMP not reviewed this encounter.   Jobany, [place], FNP 10/30/22 1545    Charma Igo, FNP 10/30/22 [phone]

## 2022-10-30 NOTE — Discharge Instructions (Addendum)
Follow up here or with your primary care provider if your symptoms are worsening or not improving with treatment.     

## 2022-10-30 NOTE — ED Triage Notes (Signed)
Pt. Presents to UC w/ c/o a nonproductive cough since Nov 21st. Pt. Was seen via telehealth 2 weeks ago and given treatment. Pt. States cough has not resolved.

## 2022-10-31 ENCOUNTER — Ambulatory Visit: Payer: Commercial Managed Care - PPO | Admitting: Family Medicine

## 2022-11-23 NOTE — Progress Notes (Unsigned)
  [name] [address] [place] [address] Phone: [phone] Subjective:    I'm seeing this patient by the request  of:  Renaye Rakers, MD  CC:   [id number]  Chase White is a 41 y.o. male coming in with complaint of back and neck pain. OMT 08/22/2022. Patient states   Medications patient has been prescribed: None  Taking:         Reviewed prior external information including notes and imaging from previsou exam, outside providers and external EMR if available.   As well as notes that were available from care everywhere and other healthcare systems.  Past medical history, social, surgical and family history all reviewed in electronic medical record.  No pertanent information unless stated regarding to the chief complaint.   Past Medical History:  Diagnosis Date   Patient on ketogenic diet     Allergies  Allergen Reactions   Codeine      Review of Systems:  No headache, visual changes, nausea, vomiting, diarrhea, constipation, dizziness, abdominal pain, skin rash, fevers, chills, night sweats, weight loss, swollen lymph nodes, body aches, joint swelling, chest pain, shortness of breath, mood changes. POSITIVE muscle aches  Objective  There were no vitals taken for this visit.   General: No apparent distress alert and oriented x3 mood and affect normal, dressed appropriately.  HEENT: Pupils equal, extraocular movements intact  Respiratory: Patient's speak in full sentences and does not appear short of breath  Cardiovascular: No lower extremity edema, non tender, no erythema  Gait MSK:  Back   Osteopathic findings  C2 flexed rotated and side bent right C6 flexed rotated and side bent left T3 extended rotated and side bent right inhaled rib T9 extended rotated and side bent left L2 flexed rotated and side bent right Sacrum right on right       Assessment and Plan:  No problem-specific Assessment & Plan notes  found for this encounter.    Nonallopathic problems  Decision today to treat with OMT was based on Physical Exam  After verbal consent patient was treated with HVLA, ME, FPR techniques in cervical, rib, thoracic, lumbar, and sacral  areas  Patient tolerated the procedure well with improvement in symptoms  Patient given exercises, stretches and lifestyle modifications  See medications in patient instructions if given  Patient will follow up in 4-8 weeks             Note: This dictation was prepared with Dragon dictation along with smaller phrase technology. Any transcriptional errors that result from this process are unintentional.

## 2022-11-28 ENCOUNTER — Ambulatory Visit (INDEPENDENT_AMBULATORY_CARE_PROVIDER_SITE_OTHER): Payer: Commercial Managed Care - PPO | Admitting: Family Medicine

## 2022-11-28 VITALS — BP 110/78 | HR 82 | Ht 71.0 in | Wt 185.0 lb

## 2022-11-28 DIAGNOSIS — M542 Cervicalgia: Secondary | ICD-10-CM | POA: Diagnosis not present

## 2022-11-28 DIAGNOSIS — M9901 Segmental and somatic dysfunction of cervical region: Secondary | ICD-10-CM

## 2022-11-28 DIAGNOSIS — M9908 Segmental and somatic dysfunction of rib cage: Secondary | ICD-10-CM | POA: Diagnosis not present

## 2022-11-28 DIAGNOSIS — M9902 Segmental and somatic dysfunction of thoracic region: Secondary | ICD-10-CM

## 2022-11-28 NOTE — Assessment & Plan Note (Signed)
Patient overall has done relatively well with just the osteopathic manipulation.  Does have some signs that is consistent with possible acromioclavicular swelling.  Discussed with patient to monitor.  Worsening pain will consider injection.  Follow-up with me again in 6 to 8 weeks

## 2022-11-28 NOTE — Patient Instructions (Addendum)
Good to see you Overall doing great Try using Voltaren at night  Follow up in 8 weeks

## 2022-12-20 ENCOUNTER — Ambulatory Visit
Admission: EM | Admit: 2022-12-20 | Discharge: 2022-12-20 | Disposition: A | Discharge status: Home / Self Care | Payer: Commercial Managed Care - PPO | Attending: Urgent Care | Admitting: Urgent Care

## 2022-12-20 DIAGNOSIS — J209 Acute bronchitis, unspecified: Secondary | ICD-10-CM

## 2022-12-20 DIAGNOSIS — B9689 Other specified bacterial agents as the cause of diseases classified elsewhere: Secondary | ICD-10-CM

## 2022-12-20 DIAGNOSIS — J019 Acute sinusitis, unspecified: Secondary | ICD-10-CM

## 2022-12-20 MED ORDER — HYDROCOD POLI-CHLORPHE POLI ER 10-8 MG/5ML PO SUER: 5.0000 mL | Freq: Two times a day (BID) | ORAL | 0 refills | Status: AC | PRN

## 2022-12-20 MED ORDER — HYDROCOD POLI-CHLORPHE POLI ER 10-8 MG/5ML PO SUER: 5.0000 mL | Freq: Two times a day (BID) | ORAL | 0 refills | Status: DC | PRN

## 2022-12-20 MED ORDER — BENZONATATE 100 MG PO CAPS: ORAL_CAPSULE | ORAL | 0 refills | Status: DC

## 2022-12-20 MED ORDER — PROMETHAZINE-DM 6.25-15 MG/5ML PO SYRP: 5.0000 mL | ORAL_SOLUTION | Freq: Four times a day (QID) | ORAL | 0 refills | Status: DC | PRN

## 2022-12-20 MED ORDER — PREDNISONE 20 MG PO TABS: ORAL_TABLET | ORAL | 0 refills | Status: AC

## 2022-12-20 MED ORDER — AMOXICILLIN-POT CLAVULANATE 875-125 MG PO TABS: 1.0000 | ORAL_TABLET | Freq: Two times a day (BID) | ORAL | 0 refills | Status: DC

## 2022-12-20 NOTE — Discharge Instructions (Signed)
Follow up here or with your primary care provider if your symptoms are worsening or not improving with treatment.     

## 2022-12-20 NOTE — ED Triage Notes (Signed)
Pt. Presents to UC w/ c/o a cough for the past 2 weeks, nasal drainage and a red splotchy rash to his left waist and abdomen. Pt. States the cough and rash have been present since having the FLU almost two weeks ago. Pt. Has been usng ibuprofen, mucinex and benzonatate for tx.

## 2022-12-20 NOTE — ED Provider Notes (Signed)
Chase White    CSN: [id number] Arrival date & time: 12/20/22  1007      History   Chief Complaint No chief complaint on file.   HPI Chase White is a 42 y.o. male.   HPI  Presents to urgent care with complaint of cough x 2 weeks, along with nasal drainage and red "splotchy" rash to his left waist and abdomen.  Patient states cough and rash have been present since he was treated for flu 2 weeks ago.  Using ibuprofen, Mucinex, benzonatate for treatment without resolution.  Patient endorses treatment for bronchitis shortly before the influenza diagnosis.  Treatment was with a course of prednisone which was effective at resolving the cough.  He was also treated with a course of prednisone in October for poison ivy dermatitis.   Past Medical History:  Diagnosis Date   Patient on ketogenic diet     Patient Active Problem List   Diagnosis Date Noted   Cervical pain (neck) 04/21/2021   Somatic dysfunction of spine, cervical 04/21/2021    Past Surgical History:  Procedure Laterality Date   APPENDECTOMY     TONSILLECTOMY     VASECTOMY         Home Medications    Prior to Admission medications   Medication Sig Start Date End Date Taking? Authorizing Provider  cholecalciferol (VITAMIN D3) 25 MCG (1000 UNIT) tablet Take 1,000 Units by mouth daily.    [provider]  Cobalamin Combinations ([place]) CAPS  01/24/21   [provider]  cyclobenzaprine (FLEXERIL) 10 MG tablet Take by mouth. 10/14/18   [provider]  fluticasone (FLONASE) 50 MCG/ACT nasal spray Place 1 spray into both nostrils 2 (two) times daily. 10/20/22   [provider]  ibuprofen (ADVIL) 800 MG tablet Take 800 mg by mouth as needed. 10/10/20   [provider]  polyethylene glycol (MIRALAX / GLYCOLAX) 17 g packet Take 17 g by mouth daily.    [provider]  rizatriptan (MAXALT) 5 MG tablet Take 1 tablet (5 mg total) by mouth as needed  for migraine. May repeat in 2 hours if needed 10/24/21   Pieter Partridge, DO  Tart Cherry 1200 MG CAPS Take by mouth.    [provider]  topiramate (TOPAMAX) 100 MG tablet TAKE 1 TABLET BY MOUTH EVERYDAY AT BEDTIME 10/02/22   Pieter Partridge, DO    Family History No family history on file.  Social History Social History   Tobacco Use   Smoking status: Former   Smokeless tobacco: Never  Scientific laboratory technician Use: Never used  Substance Use Topics   Alcohol use: Yes    Alcohol/week: 2.0 - 3.0 standard drinks of alcohol    Types: 2 - 3 Standard drinks or equivalent per week    Comment: occ.   Drug use: Never     Allergies   Codeine   Review of Systems Review of Systems   Physical Exam Triage Vital Signs ED Triage Vitals  Enc Vitals Group     BP      Pulse      Resp      Temp      Temp src      SpO2      Weight      Height      Head Circumference      Peak Flow      Pain Score      Pain Loc  Pain Edu?      Excl. in [place]?    No data found.  Updated Vital Signs There were no vitals taken for this visit.  Visual Acuity Right Eye Distance:   Left Eye Distance:   Bilateral Distance:    Right Eye Near:   Left Eye Near:    Bilateral Near:     Physical Exam Vitals reviewed.  Constitutional:      Appearance: Normal appearance. He is not ill-appearing.  Cardiovascular:     Rate and Rhythm: Normal rate and regular rhythm.     Pulses: Normal pulses.     Heart sounds: Normal heart sounds.  Pulmonary:     Effort: Pulmonary effort is normal.     Breath sounds: Normal breath sounds.  Skin:    General: Skin is warm and dry.     Findings: Rash present.       Neurological:     General: No focal deficit present.     Mental Status: He is alert and oriented to person, place, and time.  Psychiatric:        Mood and Affect: Mood normal.        Behavior: Behavior normal.      UC Treatments / Results  Labs (all labs ordered are listed, but only  abnormal results are displayed) Labs Reviewed - No data to display  EKG   Radiology No results found.  Procedures Procedures (including critical care time)  Medications Ordered in UC Medications - No data to display  Initial Impression / Assessment and Plan / UC Course  I have reviewed the triage vital signs and the nursing notes.  Pertinent labs & imaging results that were available during my care of the patient were reviewed by me and considered in my medical decision making (see chart for details).   Patient is afebrile here without recent antipyretics. Satting well on room air. Overall is well appearing, well hydrated, without respiratory distress. Pulmonary exam is unremarkable.  Lungs CTAB without wheezing, rhonchi, rales.  Continued upper respiratory symptoms are concerning for secondary bacterial sinusitis.  Will treat with a course of Augmentin.  Lower respiratory bronchitis will be treated with another course of prednisone.  Some concern as this is his third course of prednisone in about his many months.  "Rash" appears to be resolving.  Suggested use of hydrocortisone cream, however treatment with a course of prednisone is likely to resolve any remaining allergic reaction.  Otherwise advised patient that the hyperpigmentation would fade over time.  Final Clinical Impressions(s) / UC Diagnoses   Final diagnoses:  None   Discharge Instructions   None    ED Prescriptions   None    PDMP not reviewed this encounter.   [place], [place], [place] 12/20/22 1047

## 2023-01-03 ENCOUNTER — Other Ambulatory Visit: Payer: Self-pay | Admitting: Neurology

## 2023-01-17 ENCOUNTER — Telehealth: Payer: Self-pay

## 2023-01-17 NOTE — Telephone Encounter (Signed)
Spoke with patient regarding injection.

## 2023-01-17 NOTE — Telephone Encounter (Signed)
Patient called because he has an appointment next week but was wanting a call back to discuss what injection Dr. Tamala Julian was talking about for his shoulder. If he can get a call back to discuss at his phone number on file he would greatly appreciate it.

## 2023-01-22 NOTE — Progress Notes (Unsigned)
[place] Phone: [phone] Subjective:   Chase [name], am serving as a scribe for Dr. Hulan Saas.  I'm seeing this patient by the request  of:  Lucianne Lei, MD  CC: Back and neck pain follow-up  [id number]  Chase [name] is a 42 y.o. male coming in with complaint of back and neck pain. OMT 11/28/2022. Patient states that he has had some shoulder pain, R, since last visit.  Medications patient has been prescribed: None  Taking:         Reviewed prior external information including notes and imaging from previsou exam, outside providers and external EMR if available.   As well as notes that were available from care everywhere and other healthcare systems.  Past medical history, social, surgical and family history all reviewed in electronic medical record.  [name] pertanent information unless stated regarding to the chief complaint.   Past Medical History:  Diagnosis Date   Patient on ketogenic diet     Allergies  Allergen Reactions   Codeine      Review of Systems:  [name] headache, visual changes, nausea, vomiting, diarrhea, constipation, dizziness, abdominal pain, skin rash, fevers, chills, night sweats, weight loss, swollen lymph nodes, body aches, joint swelling, chest pain, shortness of breath, mood changes. POSITIVE muscle aches  Objective  Blood pressure 110/82, pulse 65, height 5' 11"$  (1.803 m), weight 184 lb (83.5 kg), SpO2 96 %.   General: [name] apparent distress alert and oriented x3 mood and affect normal, dressed appropriately.  HEENT: Pupils equal, extraocular movements intact  Respiratory: Patient's speak in full sentences and does not appear short of breath  Cardiovascular: [name] lower extremity edema, non tender, [name] erythema  Neck exam still has significant tightness along the paraspinal musculature on the right side of the neck right shoulder exam does show the patient does have  positive impingement noted as well as positive crossover.  Tender to palpation over the acromioclavicular joint.  Osteopathic findings   C6 flexed rotated and side bent right T4 extended rotated and side bent right inhaled rib  After verbal consent patient was prepped with alcohol swab and with a 25-gauge half inch needle injected into the right acromioclavicular joint with a total of 0.5 cc of 0.5% Marcaine and 0.5 cc of Kenalog 40 mg per [name] blood loss, Band-Aid placed post injection protocol given      Assessment and Plan:  Cervical pain (neck) Chronic problem with mild worsening pain.  Was found to have more of an acromioclavicular difficulties as well.  Given injection for that.  Still responding extremely well though to osteopathic manipulation.  Hopefully patient will do relatively well with conservative therapy.  Discussed different stability exercises that I think will be helpful as well.  Follow-up with me again in 6 to 8 weeks.    Nonallopathic problems  Decision today to treat with OMT was based on Physical Exam  After verbal consent patient was treated with HVLA, ME, FPR techniques in cervical, rib, thoracic, areas  Patient tolerated the procedure well with improvement in symptoms  Patient given exercises, stretches and lifestyle modifications  See medications in patient instructions if given  Patient will follow up in 4-8 weeks     The above documentation has been reviewed and is accurate and complete Lyndal Pulley, DO         Note: This dictation was prepared with Dragon dictation along with smaller phrase technology. Any  transcriptional errors that result from this process are unintentional.

## 2023-01-23 ENCOUNTER — Ambulatory Visit (INDEPENDENT_AMBULATORY_CARE_PROVIDER_SITE_OTHER): Payer: Commercial Managed Care - PPO | Admitting: Family Medicine

## 2023-01-23 VITALS — BP 110/82 | HR 65 | Ht 71.0 in | Wt 184.0 lb

## 2023-01-23 DIAGNOSIS — M542 Cervicalgia: Secondary | ICD-10-CM | POA: Diagnosis not present

## 2023-01-23 DIAGNOSIS — M9908 Segmental and somatic dysfunction of rib cage: Secondary | ICD-10-CM

## 2023-01-23 DIAGNOSIS — M25511 Pain in right shoulder: Secondary | ICD-10-CM | POA: Diagnosis not present

## 2023-01-23 DIAGNOSIS — M9902 Segmental and somatic dysfunction of thoracic region: Secondary | ICD-10-CM | POA: Diagnosis not present

## 2023-01-23 DIAGNOSIS — M9901 Segmental and somatic dysfunction of cervical region: Secondary | ICD-10-CM

## 2023-01-23 HISTORY — DX: Pain in right shoulder: M25.511

## 2023-01-23 NOTE — Assessment & Plan Note (Signed)
Injection given today and tolerated the procedure well, discussed icing regimen and home exercises, discussed which activities to do and which ones to avoid increase activity slowly.  Keep hands within peripheral vision.  Follow-up again in 6 to 8 weeks

## 2023-01-23 NOTE — Assessment & Plan Note (Signed)
Chronic problem with mild worsening pain.  Was found to have more of an acromioclavicular difficulties as well.  Given injection for that.  Still responding extremely well though to osteopathic manipulation.  Hopefully patient will do relatively well with conservative therapy.  Discussed different stability exercises that I think will be helpful as well.  Follow-up with me again in 6 to 8 weeks.

## 2023-01-23 NOTE — Patient Instructions (Addendum)
Injected shoulder See me again in 7-8 weeks

## 2023-02-19 ENCOUNTER — Ambulatory Visit (INDEPENDENT_AMBULATORY_CARE_PROVIDER_SITE_OTHER): Payer: Commercial Managed Care - PPO | Admitting: Sports Medicine

## 2023-02-19 VITALS — BP 108/74 | HR 57 | Ht 71.0 in | Wt 185.0 lb

## 2023-02-19 DIAGNOSIS — M25511 Pain in right shoulder: Secondary | ICD-10-CM

## 2023-02-19 DIAGNOSIS — G8929 Other chronic pain: Secondary | ICD-10-CM

## 2023-02-19 MED ORDER — MELOXICAM 15 MG PO TABS: 15.0000 mg | ORAL_TABLET | Freq: Every day | ORAL | 0 refills | Status: DC

## 2023-02-19 NOTE — Progress Notes (Signed)
Chase White D.[place] Phone: [phone]   Assessment and Plan:     1. Chronic right shoulder pain -Chronic with exacerbation, subsequent visit - Patient has had mild relief in pain at [facility] joint after [facility] joint CSI performed at previous office visit on 01/23/2023, however he has had worsening shoulder pain with pain radiating into right arm since that time.  Pain is most consistent with rotator cuff tendinopathy versus subacromial bursitis based on physical exam today - Start meloxicam 15 mg daily x2 weeks.  If still having pain after 2 weeks, complete 3rd-week of meloxicam. May use remaining meloxicam as needed once daily for pain control.  Do not to use additional NSAIDs while taking meloxicam.  May use Tylenol [phone] mg 2 to 3 times a day for breakthrough pain. - Recommend starting HEP for rotator cuff - Patient states that his symptoms are significantly worsening when driving as well as when working for prolonged periods of time.  Recommend patient work from home and take a 15-minute rest break once an hour to prevent flare of pain.  Note provided.  Other orders - meloxicam (MOBIC) 15 MG tablet; Take 1 tablet (15 mg total) by mouth daily.    Pertinent previous records reviewed include none   Follow Up: 3 to 4 weeks for reevaluation.  Could perform subacromial CSI if no improvement or worsening of symptoms   Subjective:   I, Chase White, am serving as a Education administrator for Doctor Glennon Mac  Chief Complaint: right shoulder pain   HPI:   02/19/23 Patient is a 42 year old male complaining of right shoulder pain for about one week. Patient states that his pain is becoming constant. Pain increases with seasoning food, brushing his teeth, mousing and sleeping. Painful in anterior shoulder that is radiating into the hand and wrist. Arm is falling sleep at night and his bicep and tricep are painful to the touch  and at rest.   Relevant Historical Information:   None pertinent  Additional pertinent review of systems negative.   Current Outpatient Medications:    cholecalciferol (VITAMIN D3) 25 MCG (1000 UNIT) tablet, Take 1,000 Units by mouth daily., Disp: , Rfl:    Cobalamin Combinations (NEURIVA PLUS) CAPS, , Disp: , Rfl:    cyclobenzaprine (FLEXERIL) 10 MG tablet, Take by mouth., Disp: , Rfl:    fluticasone (FLONASE) 50 MCG/ACT nasal spray, Place 1 spray into both nostrils 2 (two) times daily., Disp: , Rfl:    ibuprofen (ADVIL) 800 MG tablet, Take 800 mg by mouth as needed., Disp: , Rfl:    meloxicam (MOBIC) 15 MG tablet, Take 1 tablet (15 mg total) by mouth daily., Disp: 21 tablet, Rfl: 0   polyethylene glycol (MIRALAX / GLYCOLAX) 17 g packet, Take 17 g by mouth daily., Disp: , Rfl:    rizatriptan (MAXALT) 5 MG tablet, Take 1 tablet (5 mg total) by mouth as needed for migraine. May repeat in 2 hours if needed, Disp: 30 tablet, Rfl: 1   Tart Cherry 1200 MG CAPS, Take by mouth., Disp: , Rfl:    topiramate (TOPAMAX) 100 MG tablet, TAKE 1 TABLET BY MOUTH EVERYDAY AT BEDTIME, Disp: 90 tablet, Rfl: 0   Objective:     Vitals:   02/19/23 1148  BP: 108/74  Pulse: (!) 57  SpO2: 97%  Weight: 185 lb (83.9 kg)  Height: 5\' 11"  (1.803 m)      Body mass index  is 25.8 kg/m.    Physical Exam:    Gen: Appears well, nad, nontoxic and pleasant Neuro:sensation intact, strength is 5/5 with df/pf/inv/ev, muscle tone wnl Skin: no suspicious lesion or defmority Psych: A&O, appropriate mood and affect  Right shoulder:  No deformity, swelling or muscle wasting No scapular winging FF 150 with painful arc, abd 120 with painful arc, int 30 with painful arc, ext 80 with painful arc TTP biceps groove, deltoid NTTP over the [place], clavicle, ac, coracoid,   humerus,  , trapezius, cervical spine Positive neer, hawkins, empty can, obriens, crossarm, Negative subscap liftoff, speeds Neg ant drawer, sulcus sign,  apprehension Negative Spurling's test bilat FROM of neck    Electronically signed by:  Chase White D.[facility] 12:14 PM 02/19/23

## 2023-02-19 NOTE — Patient Instructions (Addendum)
  Take 15 min rest break once an hour Meloxicam 15mg  for 3 weeks Can use Tylenol 500mg -1000mg  2-3x a day for breakthrough pain RTC HEP See me in 3-4 weeks

## 2023-03-19 NOTE — Progress Notes (Unsigned)
[name] [address] [place] [address] Phone: [phone] Subjective:   Chase White, am serving as a scribe for Dr. Antoine Primas. 186lb I'm seeing this patient by the request  of:  Renaye Rakers, MD  CC:   [id number]  Chase White is a 42 y.o. male coming in with complaint of back and neck pain. OMT 01/23/2023. Patient states that he is constant pain in anterior and superior aspect of R shoulder. Patient had hand on pan that was on the stove and quickly jerked it back and had instant sharp pain. Patient said that his pain is not getting worse since visit with Dr. Jean Rosenthal. Did take meloxicam but not significant changes. Pain does radiate down to elbow and forearm and sometimes up into his neck. Minimal neck pain.   Medications patient has been prescribed: None  Taking:         Reviewed prior external information including notes and imaging from previsou exam, outside providers and external EMR if available.   As well as notes that were available from care everywhere and other healthcare systems.  Past medical history, social, surgical and family history all reviewed in electronic medical record.  No pertanent information unless stated regarding to the chief complaint.   Past Medical History:  Diagnosis Date   Patient on ketogenic diet     Allergies  Allergen Reactions   Codeine      Review of Systems:  No headache, visual changes, nausea, vomiting, diarrhea, constipation, dizziness, abdominal pain, skin rash, fevers, chills, night sweats, weight loss, swollen lymph nodes, body aches, joint swelling, chest pain, shortness of breath, mood changes. POSITIVE muscle aches  Objective  Blood pressure 102/68, pulse 84, height  (1.803 m), weight 186 lb (84.4 kg), SpO2 99 %.   General: No apparent distress alert and oriented x3 mood and affect normal, dressed appropriately.  HEENT: Pupils equal, extraocular movements  intact  Respiratory: Patient's speak in full sentences and does not appear short of breath  Cardiovascular: No lower extremity edema, non tender, no erythema   Right shoulder exam shows the patient does have significant decrease in range of motion.  He seems to have 2 degrees of external range of motion.  Weakness noted of the rotator cuff that is fairly significant with 3 out of 5 strength.  No significant atrophy of the musculature though.  Patient has limitation even with crossing the body secondary to some voluntary guarding.  Significant weakness with resistance of internal and external range of motion of the shoulder.      Assessment and Plan:  Weakness of right shoulder Significant weakness of the right shoulder now with Limited range of motion.  Patient has no internal or external range of motion of the shoulder at the moment.  No weakness of the rotator cuff noted and especially with resisted internal and external range of motion.  Voluntary guarding did not allow for empty can testing.  Discussed with patient secondary to the severity of this and affecting daily activities as well as his job and waking him up every night not responding to physical therapy, overall unable to evaluate inflammatories or injections that advanced imaging is warranted.  MRI with contrast to further evaluate for any labral pathology that could be contributing as well.  Depending on findings we will discuss next management and if it is medical or surgical.         The above documentation has been reviewed and is  accurate and complete Judi Saa, DO        Note: This dictation was prepared with Dragon dictation along with smaller phrase technology. Any transcriptional errors that result from this process are unintentional.

## 2023-03-20 ENCOUNTER — Telehealth: Payer: Self-pay | Admitting: Family Medicine

## 2023-03-20 ENCOUNTER — Other Ambulatory Visit: Payer: Self-pay

## 2023-03-20 ENCOUNTER — Encounter: Payer: Self-pay | Admitting: Family Medicine

## 2023-03-20 ENCOUNTER — Ambulatory Visit (INDEPENDENT_AMBULATORY_CARE_PROVIDER_SITE_OTHER): Payer: Commercial Managed Care - PPO | Admitting: Family Medicine

## 2023-03-20 ENCOUNTER — Ambulatory Visit (INDEPENDENT_AMBULATORY_CARE_PROVIDER_SITE_OTHER): Payer: Commercial Managed Care - PPO

## 2023-03-20 VITALS — BP 102/68 | HR 84 | Ht 71.0 in | Wt 186.0 lb

## 2023-03-20 DIAGNOSIS — R29898 Other symptoms and signs involving the musculoskeletal system: Secondary | ICD-10-CM

## 2023-03-20 DIAGNOSIS — M25511 Pain in right shoulder: Secondary | ICD-10-CM | POA: Diagnosis not present

## 2023-03-20 HISTORY — DX: Other symptoms and signs involving the musculoskeletal system: R29.898

## 2023-03-20 MED ORDER — MELOXICAM 15 MG PO TABS: 15.0000 mg | ORAL_TABLET | Freq: Every day | ORAL | 0 refills | Status: DC

## 2023-03-20 NOTE — Addendum Note (Signed)
Addended by: Ethlyn Daniels on: 03/20/2023 04:32 PM   Modules accepted: Orders

## 2023-03-20 NOTE — Telephone Encounter (Signed)
Pt called to schedule MRI but was told he would HAVE to be in the "superman pose" in order to get his MRI. He cannot do this, he asked if there was any other/larger equipment they might have to accommodate him and the answer was no.  Not sure how to proceed. Once scheduled pt will need something for anxiety as he has trouble in tight spaces.

## 2023-03-20 NOTE — Assessment & Plan Note (Signed)
Significant weakness of the right shoulder now with Limited range of motion.  Patient has no internal or external range of motion of the shoulder at the moment.  No weakness of the rotator cuff noted and especially with resisted internal and external range of motion.  Voluntary guarding did not allow for empty can testing.  Discussed with patient secondary to the severity of this and affecting daily activities as well as his job and waking him up every night not responding to physical therapy, overall unable to evaluate inflammatories or injections that advanced imaging is warranted.  MRI with contrast to further evaluate for any labral pathology that could be contributing as well.  Depending on findings we will discuss next management and if it is medical or surgical.

## 2023-03-20 NOTE — Telephone Encounter (Signed)
Per a verbal from Dr. Katrinka Blazing, patient is ok to do MRI wo contrast at [facility] if he would like. Sent patient message with information for [facility]. Order changed to that location.

## 2023-03-20 NOTE — Patient Instructions (Addendum)
Xray  MRI w contrast [id number] Refill meloxicam We will be in touch

## 2023-03-22 NOTE — Telephone Encounter (Signed)
Pt has scheduled for 5/2, just a reminder that he requested meds to help with the claustrophobia during the procedure.

## 2023-03-25 MED ORDER — DIAZEPAM 5 MG PO TABS: ORAL_TABLET | ORAL | 0 refills | Status: DC

## 2023-03-25 NOTE — Telephone Encounter (Signed)
Valium sent in

## 2023-03-27 NOTE — Addendum Note (Signed)
Addended by: Debbe Odea R on: 03/27/2023 11:38 AM   Modules accepted: Orders

## 2023-03-29 ENCOUNTER — Other Ambulatory Visit: Payer: Self-pay

## 2023-03-29 DIAGNOSIS — M25511 Pain in right shoulder: Secondary | ICD-10-CM

## 2023-04-01 ENCOUNTER — Encounter: Payer: Self-pay | Admitting: Family Medicine

## 2023-04-02 ENCOUNTER — Ambulatory Visit
Admission: RE | Admit: 2023-04-02 | Discharge: 2023-04-02 | Disposition: A | Discharge status: Home / Self Care | Payer: Commercial Managed Care - PPO | Source: Ambulatory Visit | Attending: Family Medicine | Admitting: Family Medicine

## 2023-04-02 ENCOUNTER — Other Ambulatory Visit: Payer: Commercial Managed Care - PPO

## 2023-04-02 DIAGNOSIS — M25511 Pain in right shoulder: Secondary | ICD-10-CM

## 2023-04-02 MED ORDER — IOPAMIDOL (ISOVUE-M 200) INJECTION 41%
15.0000 mL | Freq: Once | INTRAMUSCULAR | Status: AC
  Administered 2023-04-02: 15 mL via INTRA_ARTICULAR

## 2023-04-03 ENCOUNTER — Other Ambulatory Visit: Payer: Self-pay | Admitting: Neurology

## 2023-04-08 ENCOUNTER — Encounter: Payer: Self-pay | Admitting: Family Medicine

## 2023-04-18 ENCOUNTER — Ambulatory Visit: Payer: Self-pay | Admitting: Family Medicine

## 2023-04-18 ENCOUNTER — Other Ambulatory Visit: Payer: Self-pay

## 2023-04-18 ENCOUNTER — Encounter: Payer: Self-pay | Admitting: Family Medicine

## 2023-04-18 VITALS — BP 108/78 | HR 83 | Ht 71.0 in | Wt 189.0 lb

## 2023-04-18 DIAGNOSIS — M25511 Pain in right shoulder: Secondary | ICD-10-CM

## 2023-04-18 DIAGNOSIS — M75111 Incomplete rotator cuff tear or rupture of right shoulder, not specified as traumatic: Secondary | ICD-10-CM

## 2023-04-18 HISTORY — DX: Incomplete rotator cuff tear or rupture of right shoulder, not specified as traumatic: M75.111

## 2023-04-18 NOTE — Patient Instructions (Signed)
No ice or IBU for 3 days Heat and Tylenol are ok See me again in 6 weeks 

## 2023-04-18 NOTE — Progress Notes (Signed)
[name] [address] [place] [address] Phone: [phone] Subjective:   [name], am serving as a scribe for Dr. Antoine Primas.  I'm seeing this patient by the request  of:  Renaye Rakers, MD  CC: Right shoulder pain  [id number]  03/20/2023 Significant weakness of the right shoulder now with Limited range of motion.  Patient has no internal or external range of motion of the shoulder at the moment.  No weakness of the rotator cuff noted and especially with resisted internal and external range of motion.  Voluntary guarding did not allow for empty can testing.  Discussed with patient secondary to the severity of this and affecting daily activities as well as his job and waking him up every night not responding to physical therapy, overall unable to evaluate inflammatories or injections that advanced imaging is warranted.  MRI with contrast to further evaluate for any labral pathology that could be contributing as well.  Depending on findings we will discuss next management and if it is medical or surgical.     Updated 04/18/2023 Effrem Dibb is a 42 y.o. male coming in with complaint of R shoulder pain. Here for PRP       Past Medical History:  Diagnosis Date   Patient on ketogenic diet    Past Surgical History:  Procedure Laterality Date   APPENDECTOMY     TONSILLECTOMY     VASECTOMY     Social History   Socioeconomic History   Marital status: Married    Spouse name: Not on file   Number of children: Not on file   Years of education: Not on file   Highest education level: Not on file  Occupational History   Not on file  Tobacco Use   Smoking status: Former   Smokeless tobacco: Never  Vaping Use   Vaping Use: Never used  Substance and Sexual Activity   Alcohol use: Yes    Alcohol/week: 2.0 - 3.0 standard drinks of alcohol    Types: 2 - 3 Standard drinks or equivalent per week    Comment: occ.   Drug use: Never    Sexual activity: Yes  Other Topics Concern   Not on file  Social History Narrative   Right handed   Social Determinants of Health   Financial Resource Strain: Not on file  Food Insecurity: Not on file  Transportation Needs: Not on file  Physical Activity: Not on file  Stress: Not on file  Social Connections: Not on file   Allergies  Allergen Reactions   Codeine    No family history on file.    Objective  Blood pressure 108/78, pulse 83, height 5\' 11"  (1.803 m), weight 189 lb (85.7 kg), SpO2 96 %.   General: No apparent distress alert and oriented x3 mood and affect normal, dressed appropriately.  HEENT: Pupils equal, extraocular movements intact  Respiratory: Patient's speak in full sentences and does not appear short of breath  Cardiovascular: No lower extremity edema, non tender, no erythema  Right shoulder does have some tenderness noted.   Procedure: Real-time Ultrasound Guided Injection of right supraspinatus tendon sheath  Device: GE Logiq Q7  Ultrasound guided injection is preferred based studies that show increased duration, increased effect, greater accuracy, decreased procedural pain, increased response rate with ultrasound guided versus blind injection.  Verbal informed consent obtained.  Time-out conducted.  Noted no overlying erythema, induration, or other signs of local infection.  Skin prepped in  a sterile fashion.  Local anesthesia: Topical Ethyl chloride.  With sterile technique and under real time ultrasound guidance:  Joint visualized.  With a 21-gauge 2 inch needle patient was injected with 0.5 cc of 0.5% Marcaine from the lateral perspective into the supraspinatus tendon sheath.  Then injected with 5 cc of PRP Completed without difficulty  Pain immediately resolved suggesting accurate placement of the medication.  Advised to call if fevers/chills, erythema, induration, drainage, or persistent bleeding.  Impression: Technically successful ultrasound  guided injection.   Impression and Recommendations:     The above documentation has been reviewed and is accurate and complete Judi Saa, DO

## 2023-04-18 NOTE — Assessment & Plan Note (Signed)
PRP given today, post PRP instructions given.  Follow-up again in 6 to 8 weeks

## 2023-04-26 ENCOUNTER — Encounter: Payer: Self-pay | Admitting: Family Medicine

## 2023-05-28 NOTE — Progress Notes (Unsigned)
[name] [address] [place] [address] Phone: [phone] Subjective:   [name], am serving as a scribe for Dr. Antoine Primas.  I'm seeing this patient by the request  of:  Renaye Rakers, MD  CC: Right shoulder follow-up  [id number]  Chase White is a 42 y.o. male coming in with complaint of R shoulder pain. PRP 04/18/2023. Patient states that he has felt improvement. Sharp pain has subsided. Sleeping increases his pain and in the morning he has limited ROM. Finds it hard to do pec stretch due to decreased ROM.     Past Medical History:  Diagnosis Date   Patient on ketogenic diet    Past Surgical History:  Procedure Laterality Date   APPENDECTOMY     TONSILLECTOMY     VASECTOMY     Social History   Socioeconomic History   Marital status: Married    Spouse name: Not on file   Number of children: Not on file   Years of education: Not on file   Highest education level: Not on file  Occupational History   Not on file  Tobacco Use   Smoking status: Former   Smokeless tobacco: Never  Vaping Use   Vaping Use: Never used  Substance and Sexual Activity   Alcohol use: Yes    Alcohol/week: 2.0 - 3.0 standard drinks of alcohol    Types: 2 - 3 Standard drinks or equivalent per week    Comment: occ.   Drug use: Never   Sexual activity: Yes  Other Topics Concern   Not on file  Social History Narrative   Right handed   Social Determinants of Health   Financial Resource Strain: Not on file  Food Insecurity: Not on file  Transportation Needs: Not on file  Physical Activity: Not on file  Stress: Not on file  Social Connections: Not on file   Allergies  Allergen Reactions   Codeine    No family history on file.    Current Outpatient Medications (Respiratory):    fluticasone (FLONASE) 50 MCG/ACT nasal spray, Place 1 spray into both nostrils 2 (two) times daily.  Current Outpatient Medications (Analgesics):     ibuprofen (ADVIL) 800 MG tablet, Take 800 mg by mouth as needed.   rizatriptan (MAXALT) 5 MG tablet, Take 1 tablet (5 mg total) by mouth as needed for migraine. May repeat in 2 hours if needed  Current Outpatient Medications (Hematological):    Cobalamin Combinations (NEURIVA PLUS) CAPS,   Current Outpatient Medications (Other):    cholecalciferol (VITAMIN D3) 25 MCG (1000 UNIT) tablet, Take 1,000 Units by mouth daily.   cyclobenzaprine (FLEXERIL) 10 MG tablet, Take by mouth.   polyethylene glycol (MIRALAX / GLYCOLAX) 17 g packet, Take 17 g by mouth daily.   Tart Cherry 1200 MG CAPS, Take by mouth.   topiramate (TOPAMAX) 100 MG tablet, TAKE 1 TABLET BY MOUTH EVERYDAY AT BEDTIME   diazepam (VALIUM) 5 MG tablet, One tab by mouth, 2 hours before procedure.   Review of Systems:  No headache, visual changes, nausea, vomiting, diarrhea, constipation, dizziness, abdominal pain, skin rash, fevers, chills, night sweats, weight loss, swollen lymph nodes, body aches, joint swelling, chest pain, shortness of breath, mood changes. POSITIVE muscle aches  Objective  Blood pressure 102/72, pulse 90, height 5\' 11"  (1.803 m), weight 184 lb (83.5 kg), SpO2 98 %.   General: No apparent distress alert and oriented x3 mood and affect normal, dressed appropriately.  HEENT: Pupils equal, extraocular movements intact  Respiratory: Patient's speak in full sentences and does not appear short of breath  Cardiovascular: No lower extremity edema, non tender, no erythema  Right shoulder exam shows still decreased active range of motion.  Significant improvement in passive range of motion improvement in the rotator cuff strength noted fairly significant as well.  Limited muscular skeletal ultrasound was performed and interpreted by Antoine Primas, M  Limited ultrasound shows the patient does have hyperechoic changes of the supraspinatus that is consistent with scar tissue formation.  Significant decrease in hypoechoic  changes that was previously seen in the subacromial space.  Impression: Interval improvement.      Impression and Recommendations:    The above documentation has been reviewed and is accurate and complete Judi Saa, DO

## 2023-05-29 ENCOUNTER — Encounter: Payer: Self-pay | Admitting: Family Medicine

## 2023-05-29 ENCOUNTER — Ambulatory Visit (INDEPENDENT_AMBULATORY_CARE_PROVIDER_SITE_OTHER): Payer: Commercial Managed Care - PPO | Admitting: Family Medicine

## 2023-05-29 ENCOUNTER — Other Ambulatory Visit: Payer: Self-pay

## 2023-05-29 VITALS — BP 102/72 | HR 90 | Ht 71.0 in | Wt 184.0 lb

## 2023-05-29 DIAGNOSIS — M75111 Incomplete rotator cuff tear or rupture of right shoulder, not specified as traumatic: Secondary | ICD-10-CM

## 2023-05-29 DIAGNOSIS — M25511 Pain in right shoulder: Secondary | ICD-10-CM

## 2023-05-29 NOTE — Patient Instructions (Signed)
Great scar tissue noted Healing well Keep working on ROM Massage is a good idea See in 6-8 weeks

## 2023-05-29 NOTE — Progress Notes (Signed)
NEUROLOGY FOLLOW UP OFFICE NOTE  Chase [facility] [id number]  Assessment/Plan:   Migraine with aura, without status migrainosus, not intractable   Migraine prevention:  topiramate 100mg  at bedtime  Migraine rescue:  rizatriptan 10mg   Limit use of pain relievers to no more than 2 days out of week to prevent risk of rebound or medication-overuse headache. Keep headache diary Follow up one year      Subjective:  Chase White is a 42 year old right-handed male who follows up for migraine.   UPDATE: Duration:  2 hours with rizatriptan Frequency:  2-3 days over last year Current NSAIDS/analgesics:  Ibuprofen 800mg  Current triptans:  Maxalt 10mg  Current ergotamine:  none Current anti-emetic:  none Current muscle relaxants:  none Current Antihypertensive medications:  none Current Antidepressant medications:  none Current Anticonvulsant medications:  topiramate 100mg  QHS Current anti-CGRP:  none Current Vitamins/Herbal/Supplements:  none Current Antihistamines/Decongestants:  none Other therapy:  OMM Hormone/birth control:  none   Caffeine:  Backed off but started drinking again on average 2 cups coffee daily.  No soda. Diet:  Drinks plenty of water.  Keto diet but has more off days now.  Cut down on sodium intake.   Exercise:  not Depression:  no; Anxiety:  no Other pain:  Shoulder pain (rotator cuff tear) Sleep hygiene: lately restless but usually good.   HISTORY:  He had a first-time migraine over this past year.  it was severe but can't remember head location.  It was preceded by a streak of light across his vision, and associated with nausea, motion sickness, photophobia and phonophobia.  No weakness and numbness.  No known trigger.  It lasted about 4-5 hours and resolved  He had a second similar headache on 10/30 that occurred spontaneously.  Over the next week it would come and go without aura, lasting a couple of hours.  He went to [facility] on  10/03/2020 for intractable headache after being advised to be tested for COVID-19, which was negative.  He had a CT of the head on 10/18/2020 which was personally reviewed and was negative.  It continued to be daily, but without aura.  He was placed on daily Ubrelvy which helped.  Due to ongoing headaches, he went to the ED at [facility] on 10/27/2020 where he was given referral to neurology and advised to follow up with his eye doctor.  Last severe headache roughly a week later.  He still has a dull persistent left retro-orbital pressure.  He has been taking ibuprofen about once a week.  Work is all on computer so screen time aggravates it.  Since these headaches, he has been very dizzy and prone to motion sickness, such as riding in a car.  MRI of brain with and without contrast on 12/10/2020 was normal.     Past NSAIDS/analgesics:  none Past abortive triptans:  none Past abortive ergotamine:  none Past muscle relaxants:  none Past anti-emetic:  none Past antihypertensive medications:  none Past antidepressant medications:  nortriptyline (tachycardia), venlafaxine Past anticonvulsant medications:  none Past anti-CGRP:  Bernita Raisin Past vitamins/Herbal/Supplements:  none Past antihistamines/decongestants:  meclizine Other past therapies:  none     Family history of headache:  He thinks migraines may run in the family.  PAST MEDICAL HISTORY: Past Medical History:  Diagnosis Date   Patient on ketogenic diet     MEDICATIONS: Current Outpatient Medications on File Prior to Visit  Medication Sig Dispense Refill   cholecalciferol (VITAMIN D3) 25 MCG (1000  UNIT) tablet Take 1,000 Units by mouth daily.     Cobalamin Combinations (NEURIVA PLUS) CAPS      cyclobenzaprine (FLEXERIL) 10 MG tablet Take by mouth.     diazepam (VALIUM) 5 MG tablet One tab by mouth, 2 hours before procedure. 2 tablet 0   fluticasone (FLONASE) 50 MCG/ACT nasal spray Place 1 spray into both nostrils 2 (two) times daily.      ibuprofen (ADVIL) 800 MG tablet Take 800 mg by mouth as needed.     polyethylene glycol (MIRALAX / GLYCOLAX) 17 g packet Take 17 g by mouth daily.     rizatriptan (MAXALT) 5 MG tablet Take 1 tablet (5 mg total) by mouth as needed for migraine. May repeat in 2 hours if needed 30 tablet 1   Tart Cherry 1200 MG CAPS Take by mouth.     topiramate (TOPAMAX) 100 MG tablet TAKE 1 TABLET BY MOUTH EVERYDAY AT BEDTIME 90 tablet 1   No current facility-administered medications on file prior to visit.    ALLERGIES: Allergies  Allergen Reactions   Codeine     FAMILY HISTORY: No family history on file.    Objective:  Blood pressure 99/68, pulse 90, height 5\' 11"  (1.803 m), weight 187 lb (84.8 kg), SpO2 97 %. General: No acute distress.  Patient appears well-groomed.   Head:  Normocephalic/atraumatic Eyes:  Fundi examined but not visualized Neck: supple, no paraspinal tenderness, full range of motion, some mild left shoulder pain Heart:  Regular rate and rhythm Neurological Exam: alert and oriented.  Speech fluent and not dysarthric, language intact.  CN II-XII intact. Bulk and tone normal, muscle strength 5/5 throughout.  Sensation to light touch intact.  Deep tendon reflexes 2+ throughout.  Finger to nose testing intact.  Gait normal, Romberg negative.   Chase Millet, DO   CC:  Renaye Rakers, MD

## 2023-05-29 NOTE — Assessment & Plan Note (Signed)
Patient on ultrasound does show some improvement noted.  Discussed with activities to continue to do and to start increasing range of motion.  I believe the patient should do relatively well with this.  Discussed home exercises, icing regimen formal physical therapy if necessary.  Follow-up with me again 2 months

## 2023-06-03 ENCOUNTER — Encounter: Payer: Self-pay | Admitting: Neurology

## 2023-06-03 ENCOUNTER — Ambulatory Visit (INDEPENDENT_AMBULATORY_CARE_PROVIDER_SITE_OTHER): Payer: Commercial Managed Care - PPO | Admitting: Neurology

## 2023-06-03 DIAGNOSIS — G43801 Other migraine, not intractable, with status migrainosus: Secondary | ICD-10-CM

## 2023-06-03 MED ORDER — TOPIRAMATE 100 MG PO TABS: ORAL_TABLET | ORAL | 3 refills | Status: DC

## 2023-06-03 MED ORDER — RIZATRIPTAN BENZOATE 5 MG PO TABS: 5.0000 mg | ORAL_TABLET | ORAL | 3 refills | Status: DC | PRN

## 2023-06-03 NOTE — Patient Instructions (Signed)
Topiramate rizatriptan

## 2023-07-16 NOTE — Progress Notes (Unsigned)
[facility] [address] [place] [address] Phone: [phone] Subjective:   Chase White, am serving as a scribe for Dr. Antoine White.  I'm seeing this patient by the request  of:  Chase Rakers, MD  CC: Shoulder pain follow-up  [id number]  05/29/2023 Patient on ultrasound does show some improvement noted.  Discussed with activities to continue to do and to start increasing range of motion.  I believe the patient should do relatively well with this.  Discussed home exercises, icing regimen formal physical therapy if necessary.  Follow-up with me again 2 months      Update 07/17/2023 Chase White is a 42 y.o. male coming in with complaint of R shoulder pain. Patient states that he is doing much better. Shoulder is stiff. Less pain.        Past Medical History:  Diagnosis Date   Patient on ketogenic diet    Past Surgical History:  Procedure Laterality Date   APPENDECTOMY     TONSILLECTOMY     VASECTOMY     Social History   Socioeconomic History   Marital status: Married    Spouse name: Not on file   Number of children: Not on file   Years of education: Not on file   Highest education level: Not on file  Occupational History   Not on file  Tobacco Use   Smoking status: Former   Smokeless tobacco: Never  Vaping Use   Vaping status: Never Used  Substance and Sexual Activity   Alcohol use: Yes    Alcohol/week: 2.0 - 3.0 standard drinks of alcohol    Types: 2 - 3 Standard drinks or equivalent per week    Comment: occ.   Drug use: Never   Sexual activity: Yes  Other Topics Concern   Not on file  Social History Narrative   Right handed   Social Determinants of Health   Financial Resource Strain: Not on file  Food Insecurity: Not on file  Transportation Needs: Not on file  Physical Activity: Not on file  Stress: Not on file  Social Connections: Unknown (03/21/2023)   Received from [facility]   Social Network     Social Network: Not on file   Allergies  Allergen Reactions   Codeine    No family history on file.    Current Outpatient Medications (Respiratory):    fluticasone (FLONASE) 50 MCG/ACT nasal spray, Place 1 spray into both nostrils 2 (two) times daily.  Current Outpatient Medications (Analgesics):    ibuprofen (ADVIL) 800 MG tablet, Take 800 mg by mouth as needed.   rizatriptan (MAXALT) 5 MG tablet, Take 1 tablet (5 mg total) by mouth as needed for migraine. May repeat in 2 hours if needed  Current Outpatient Medications (Hematological):    Cobalamin Combinations (NEURIVA PLUS) CAPS,   Current Outpatient Medications (Other):    cholecalciferol (VITAMIN D3) 25 MCG (1000 UNIT) tablet, Take 1,000 Units by mouth daily.   polyethylene glycol (MIRALAX / GLYCOLAX) 17 g packet, Take 17 g by mouth daily.   Tart Cherry 1200 MG CAPS, Take by mouth.   topiramate (TOPAMAX) 100 MG tablet, TAKE 1 TABLET BY MOUTH EVERYDAY AT BEDTIME   Reviewed prior external information including notes and imaging from  primary care provider As well as notes that were available from care everywhere and other healthcare systems.  Past medical history, social, surgical and family history all reviewed in electronic medical record.  No pertanent information  unless stated regarding to the chief complaint.   Review of Systems:  No headache, visual changes, nausea, vomiting, diarrhea, constipation, dizziness, abdominal pain, skin rash, fevers, chills, night sweats, weight loss, swollen lymph nodes, body aches, joint swelling, chest pain, shortness of breath, mood changes. POSITIVE muscle aches  Objective  Blood pressure 110/78, pulse 74, height 5\' 11"  (1.803 m), weight 185 lb (83.9 kg), SpO2 98%.   General: No apparent distress alert and oriented x3 mood and affect normal, dressed appropriately.  HEENT: Pupils equal, extraocular movements intact  Respiratory: Patient's speak in full sentences and does not appear  short of breath  Cardiovascular: No lower extremity edema, non tender, no erythema  Right shoulder exam does have no tenderness to palpation patient does have some limited range of motion in all planes.  Especially with external rotation of only 5 degrees.  Rotator cuff strength does appear to be intact and improved from previous exam but still has some weakness compared to the contralateral side.  Neck exam very mild loss of lordosis but negative Spurling's  Limited muscular skeletal ultrasound was performed and interpreted by Chase White, M  Limited ultrasound is difficult secondary to patient's loss of range of motion.  Does seem to have some thickening of the anterior and superior capsule noted though.    Impression and Recommendations:     The above documentation has been reviewed and is accurate and complete Chase Saa, DO

## 2023-07-17 ENCOUNTER — Other Ambulatory Visit: Payer: Self-pay

## 2023-07-17 ENCOUNTER — Ambulatory Visit (INDEPENDENT_AMBULATORY_CARE_PROVIDER_SITE_OTHER): Payer: Commercial Managed Care - PPO | Admitting: Family Medicine

## 2023-07-17 ENCOUNTER — Encounter: Payer: Self-pay | Admitting: Family Medicine

## 2023-07-17 VITALS — BP 110/78 | HR 74 | Ht 71.0 in | Wt 185.0 lb

## 2023-07-17 DIAGNOSIS — M75111 Incomplete rotator cuff tear or rupture of right shoulder, not specified as traumatic: Secondary | ICD-10-CM

## 2023-07-17 DIAGNOSIS — M25511 Pain in right shoulder: Secondary | ICD-10-CM | POA: Diagnosis not present

## 2023-07-17 NOTE — Assessment & Plan Note (Signed)
Significant healing noted.  Unfortunately patient does have significant loss of range of motion of the shoulder still.  We did discuss different treatment options including a nerve conduction study which patient declined.  Seems to have still thickening of the fascia noted otherwise.-Encourage patient to work on the range of motion.  If no significant improvement do highly recommend a nerve conduction study.  Patient is going to follow-up with me again in 2 months otherwise.

## 2023-07-17 NOTE — Patient Instructions (Signed)
Move your arm as much as possible Ok to lift Give update in a month See me in 2 months

## 2023-08-20 ENCOUNTER — Encounter: Payer: Self-pay | Admitting: Family Medicine

## 2023-09-17 NOTE — Progress Notes (Unsigned)
[facility] [address] [place] [address] Phone: [phone] Subjective:   Chase White, am serving as a scribe for Chase White.  I'm seeing this patient by the request  of:  Chase Rakers, MD  CC: right shoulder pain   [id number]  07/17/2023 Significant healing noted.  Unfortunately patient does have significant loss of range of motion of the shoulder still.  We did discuss different treatment options including a nerve conduction study which patient declined.  Seems to have still thickening of the fascia noted otherwise.-Encourage patient to work on the range of motion.  If no significant improvement do highly recommend a nerve conduction study.  Patient is going to follow-up with me again in 2 months otherwise.      Update 09/18/2023 Lawrnce White is a 42 y.o. male coming in with complaint of R shoulder pain. Patient states that he is doing better. Less pain and more motion. Reaching overhead is still limited. Feels more of a stiffness vs pain.       Past Medical History:  Diagnosis Date   Patient on ketogenic diet    Past Surgical History:  Procedure Laterality Date   APPENDECTOMY     TONSILLECTOMY     VASECTOMY     Social History   Socioeconomic History   Marital status: Married    Spouse name: Not on file   Number of children: Not on file   Years of education: Not on file   Highest education level: Not on file  Occupational History   Not on file  Tobacco Use   Smoking status: Former   Smokeless tobacco: Never  Vaping Use   Vaping status: Never Used  Substance and Sexual Activity   Alcohol use: Yes    Alcohol/week: 2.0 - 3.0 standard drinks of alcohol    Types: 2 - 3 Standard drinks or equivalent per week    Comment: occ.   Drug use: Never   Sexual activity: Yes  Other Topics Concern   Not on file  Social History Narrative   Right handed   Social Determinants of Health   Financial Resource Strain:  Not on file  Food Insecurity: Not on file  Transportation Needs: Not on file  Physical Activity: Not on file  Stress: Not on file  Social Connections: Unknown (03/21/2023)   Received from [facility]   Social Network    Social Network: Not on file   Allergies  Allergen Reactions   Codeine    No family history on file.    Current Outpatient Medications (Respiratory):    fluticasone (FLONASE) 50 MCG/ACT nasal spray, Place 1 spray into both nostrils 2 (two) times daily.  Current Outpatient Medications (Analgesics):    ibuprofen (ADVIL) 800 MG tablet, Take 800 mg by mouth as needed.   rizatriptan (MAXALT) 5 MG tablet, Take 1 tablet (5 mg total) by mouth as needed for migraine. May repeat in 2 hours if needed  Current Outpatient Medications (Hematological):    Cobalamin Combinations (NEURIVA PLUS) CAPS,   Current Outpatient Medications (Other):    cholecalciferol (VITAMIN D3) 25 MCG (1000 UNIT) tablet, Take 1,000 Units by mouth daily.   polyethylene glycol (MIRALAX / GLYCOLAX) 17 g packet, Take 17 g by mouth daily.   Tart Cherry 1200 MG CAPS, Take by mouth.   topiramate (TOPAMAX) 100 MG tablet, TAKE 1 TABLET BY MOUTH EVERYDAY AT BEDTIME   Reviewed prior external information including notes and imaging from  primary care provider As well as notes that were available from care everywhere and other healthcare systems.  Past medical history, social, surgical and family history all reviewed in electronic medical record.  No pertanent information unless stated regarding to the chief complaint.   Review of Systems:  No headache, visual changes, nausea, vomiting, diarrhea, constipation, dizziness, abdominal pain, skin rash, fevers, chills, night sweats, weight loss, swollen lymph nodes, joint swelling, chest pain, shortness of breath, mood changes. POSITIVE muscle aches, back pain   Objective  Blood pressure 104/74, pulse 92, height 5\' 11"  (1.803 m), weight 189 lb (85.7 kg), SpO2  98%.   General: No apparent distress alert and oriented x3 mood and affect normal, dressed appropriately.  HEENT: Pupils equal, extraocular movements intact  Respiratory: Patient's speak in full sentences and does not appear short of breath  Cardiovascular: No lower extremity edema, non tender, no erythema  Right shoulder exam shows impingement with external rotation  Noted.  Patient does have though great strength of the rotator cuff.  Limited muscular skeletal ultrasound was performed and interpreted by Chase White, M  Limited ultrasound shows the patient does have still thickening of the superior and posterior capsule but otherwise rotator cuff appears to be unremarkable.  Supraspinatus seems to be well-healed..     Impression and Recommendations:      The above documentation has been reviewed and is accurate and complete Chase Saa, DO

## 2023-09-18 ENCOUNTER — Ambulatory Visit: Payer: Commercial Managed Care - PPO | Admitting: Family Medicine

## 2023-09-18 ENCOUNTER — Other Ambulatory Visit: Payer: Self-pay

## 2023-09-18 ENCOUNTER — Encounter: Payer: Self-pay | Admitting: Family Medicine

## 2023-09-18 VITALS — BP 104/74 | HR 92 | Ht 71.0 in | Wt 189.0 lb

## 2023-09-18 DIAGNOSIS — M25511 Pain in right shoulder: Secondary | ICD-10-CM | POA: Diagnosis not present

## 2023-09-18 DIAGNOSIS — M75111 Incomplete rotator cuff tear or rupture of right shoulder, not specified as traumatic: Secondary | ICD-10-CM | POA: Diagnosis not present

## 2023-09-18 NOTE — Assessment & Plan Note (Signed)
Fully healed at this time, still has though unfortunately thickening of the capsule and still has what appears to be loss still of external range of motion as well.

## 2023-09-18 NOTE — Patient Instructions (Signed)
Getting better and better Keep working on it [employer] See me in 3 months

## 2023-12-17 NOTE — Progress Notes (Unsigned)
[name] [address] [place] [address] Phone: [phone] Subjective:   [name], am serving as a scribe for Dr. Antoine Primas.  I'm seeing this patient by the request  of:  Renaye Rakers, MD  CC: Right shoulder pain follow-up  ION:[id number]  09/18/2023 Fully healed at this time, still has though unfortunately thickening of the capsule and still has what appears to be loss still of external range of motion as well.      Update 12/18/2023 Adrean Molzahn is a 43 y.o. male coming in with complaint of R shoulder pain. Patient states no more sharp pains. More like soreness after a workout. No new concerns.  States that there is no more pain and no weakness, still having some limited range of motion but nothing severe is getting as had previously.  Feels like he is making progress little by little still.       Past Medical History:  Diagnosis Date   Patient on ketogenic diet    Past Surgical History:  Procedure Laterality Date   APPENDECTOMY     TONSILLECTOMY     VASECTOMY     Social History   Socioeconomic History   Marital status: Married    Spouse name: Not on file   Number of children: Not on file   Years of education: Not on file   Highest education level: Not on file  Occupational History   Not on file  Tobacco Use   Smoking status: Former   Smokeless tobacco: Never  Vaping Use   Vaping status: Never Used  Substance and Sexual Activity   Alcohol use: Yes    Alcohol/week: 2.0 - 3.0 standard drinks of alcohol    Types: 2 - 3 Standard drinks or equivalent per week    Comment: occ.   Drug use: Never   Sexual activity: Yes  Other Topics Concern   Not on file  Social History Narrative   Right handed   Social Drivers of Health   Financial Resource Strain: Not on file  Food Insecurity: Not on file  Transportation Needs: Not on file  Physical Activity: Not on file  Stress: Not on file  Social Connections:  Unknown (03/21/2023)   Received from [facility], [facility]   Social Network    Social Network: Not on file   Allergies  Allergen Reactions   Codeine    No family history on file.    Current Outpatient Medications (Respiratory):    fluticasone (FLONASE) 50 MCG/ACT nasal spray, Place 1 spray into both nostrils 2 (two) times daily.  Current Outpatient Medications (Analgesics):    ibuprofen (ADVIL) 800 MG tablet, Take 800 mg by mouth as needed.   rizatriptan (MAXALT) 5 MG tablet, Take 1 tablet (5 mg total) by mouth as needed for migraine. May repeat in 2 hours if needed  Current Outpatient Medications (Hematological):    Cobalamin Combinations (NEURIVA PLUS) CAPS,   Current Outpatient Medications (Other):    cholecalciferol (VITAMIN D3) 25 MCG (1000 UNIT) tablet, Take 1,000 Units by mouth daily.   polyethylene glycol (MIRALAX / GLYCOLAX) 17 g packet, Take 17 g by mouth daily.   Tart Cherry 1200 MG CAPS, Take by mouth.   topiramate (TOPAMAX) 100 MG tablet, TAKE 1 TABLET BY MOUTH EVERYDAY AT BEDTIME   Objective  Blood pressure 116/86, pulse 85, height 5\' 11"  (1.803 m), weight 191 lb (86.6 kg), SpO2 96%.   General: No apparent distress alert and oriented  x3 mood and affect normal, dressed appropriately.  HEENT: Pupils equal, extraocular movements intact  Respiratory: Patient's speak in full sentences and does not appear short of breath  Cardiovascular: No lower extremity edema, non tender, no erythema  Shoulder does have improvement in strength with 5 out of 5 strength noted.  Patient does still have some limitation especially with internal and external range of motion of the shoulder.  Tightness noted to the posterior capsule.   Limited muscular skeletal ultrasound was performed and interpreted by Antoine Primas, M  Limited ultrasound shows the patient does have some thickness of the posterior capsule noted.  Very mild hypoechoic changes around the infraspinatus.  Patient  though does seem to be doing much better overall though with hypoechoic changes being almost completely gone. Impression: Significant interval improvement   Impression and Recommendations:     The above documentation has been reviewed and is accurate and complete Judi Saa, DO

## 2023-12-18 ENCOUNTER — Ambulatory Visit (INDEPENDENT_AMBULATORY_CARE_PROVIDER_SITE_OTHER): Payer: Commercial Managed Care - PPO | Admitting: Family Medicine

## 2023-12-18 ENCOUNTER — Encounter: Payer: Self-pay | Admitting: Family Medicine

## 2023-12-18 ENCOUNTER — Other Ambulatory Visit: Payer: Self-pay

## 2023-12-18 VITALS — BP 116/86 | HR 85 | Ht 71.0 in | Wt 191.0 lb

## 2023-12-18 DIAGNOSIS — M75111 Incomplete rotator cuff tear or rupture of right shoulder, not specified as traumatic: Secondary | ICD-10-CM | POA: Diagnosis not present

## 2023-12-18 DIAGNOSIS — M25511 Pain in right shoulder: Secondary | ICD-10-CM | POA: Diagnosis not present

## 2023-12-18 NOTE — Assessment & Plan Note (Signed)
Seems to be completely healed at this time.  Patient is now over 6 months out and do believe the patient can improvement in range of motion still.  Discussed with patient about icing regimen and home exercises otherwise.  Follow-up with me again as needed.

## 2023-12-23 ENCOUNTER — Ambulatory Visit
Admission: EM | Admit: 2023-12-23 | Discharge: 2023-12-23 | Disposition: A | Discharge status: Home / Self Care | Payer: Commercial Managed Care - PPO | Attending: Emergency Medicine | Admitting: Emergency Medicine

## 2023-12-23 ENCOUNTER — Other Ambulatory Visit: Payer: Self-pay

## 2023-12-23 ENCOUNTER — Encounter: Payer: Self-pay | Admitting: Emergency Medicine

## 2023-12-23 DIAGNOSIS — J0100 Acute maxillary sinusitis, unspecified: Secondary | ICD-10-CM | POA: Diagnosis not present

## 2023-12-23 MED ORDER — AMOXICILLIN-POT CLAVULANATE 875-125 MG PO TABS: 1.0000 | ORAL_TABLET | Freq: Two times a day (BID) | ORAL | 0 refills | Status: DC

## 2023-12-23 MED ORDER — ONDANSETRON 4 MG PO TBDP: 4.0000 mg | ORAL_TABLET | Freq: Three times a day (TID) | ORAL | 0 refills | Status: DC | PRN

## 2023-12-23 NOTE — ED Provider Notes (Signed)
Chase White    CSN: [id number] Arrival date & time: 12/23/23  4098      History   Chief Complaint Chief Complaint  Patient presents with   URI    HPI Chase White is a 43 y.o. male.   Patient presents for evaluation of nasal congestion, rhinorrhea, productive cough, sinus pressure along the bilateral cheeks, intermittent migraines present for 5 days.  Has begun to experience blood-tinged sputum and the mucus in the nares which caused episode of nausea without vomiting this morning.  Also began to experience soft to watery diarrhea this morning.  Has improved with use of Pepto-Bismol.  Tolerating food and liquids.  No known sick contacts prior.  Denies shortness of breath, wheezing or fever.  Past Medical History:  Diagnosis Date   Patient on ketogenic diet     Patient Active Problem List   Diagnosis Date Noted   Partial nontraumatic tear of rotator cuff, right 04/18/2023   Weakness of right shoulder 03/20/2023   Pain in right acromioclavicular joint 01/23/2023   Cervical pain (neck) 04/21/2021   Somatic dysfunction of spine, cervical 04/21/2021    Past Surgical History:  Procedure Laterality Date   APPENDECTOMY     TONSILLECTOMY     VASECTOMY         Home Medications    Prior to Admission medications   Medication Sig Start Date End Date Taking? Authorizing Provider  amoxicillin-clavulanate (AUGMENTIN) 875-125 MG tablet Take 1 tablet by mouth every 12 (twelve) hours. 12/23/23  Yes Delcie Ruppert R, NP  ondansetron (ZOFRAN-ODT) 4 MG disintegrating tablet Take 1 tablet (4 mg total) by mouth every 8 (eight) hours as needed. 12/23/23  Yes Parag Dorton R, NP  cholecalciferol (VITAMIN D3) 25 MCG (1000 UNIT) tablet Take 1,000 Units by mouth daily.    [provider]  Cobalamin Combinations (NEURIVA PLUS) CAPS  01/24/21   [provider]  fluticasone (FLONASE) 50 MCG/ACT nasal spray Place 1 spray into both nostrils 2 (two) times  daily. Patient not taking: Reported on 12/23/2023 10/20/22   [provider]  ibuprofen (ADVIL) 800 MG tablet Take 800 mg by mouth as needed. 10/10/20   [provider]  polyethylene glycol (MIRALAX / GLYCOLAX) 17 g packet Take 17 g by mouth daily.    [provider]  rizatriptan (MAXALT) 5 MG tablet Take 1 tablet (5 mg total) by mouth as needed for migraine. May repeat in 2 hours if needed 06/03/23   Drema Dallas, DO  Tart Cherry 1200 MG CAPS Take by mouth.    [provider]  topiramate (TOPAMAX) 100 MG tablet TAKE 1 TABLET BY MOUTH EVERYDAY AT BEDTIME 06/03/23   Drema Dallas, DO    Family History History reviewed. No pertinent family history.  Social History Social History   Tobacco Use   Smoking status: Former   Smokeless tobacco: Never  Vaping Use   Vaping status: Never Used  Substance Use Topics   Alcohol use: Yes    Alcohol/week: 2.0 - 3.0 standard drinks of alcohol    Types: 2 - 3 Standard drinks or equivalent per week    Comment: occ.   Drug use: Never     Allergies   Codeine   Review of Systems Review of Systems   Physical Exam Triage Vital Signs ED Triage Vitals  Encounter Vitals Group     BP 12/23/23 0853 113/77     Systolic BP Percentile --      Diastolic  BP Percentile --      Pulse Rate 12/23/23 0853 85     Resp 12/23/23 0853 20     Temp 12/23/23 0853 98.7 F (37.1 C)     Temp Source 12/23/23 0853 Oral     SpO2 12/23/23 0853 99 %     Weight --      Height --      Head Circumference --      Peak Flow --      Pain Score 12/23/23 0851 2     Pain Loc --      Pain Education --      Exclude from Growth Chart --    No data found.  Updated Vital Signs BP 113/77 (BP Location: Left Arm)   Pulse 85   Temp 98.7 F (37.1 C) (Oral)   Resp 20   SpO2 99%   Visual Acuity Right Eye Distance:   Left Eye Distance:   Bilateral Distance:    Right Eye Near:   Left Eye Near:    Bilateral Near:     Physical  Exam Constitutional:      Appearance: Normal appearance.  HENT:     Head: Normocephalic.     Right Ear: Tympanic membrane, ear canal and external ear normal.     Left Ear: Tympanic membrane, ear canal and external ear normal.     Nose: Congestion present. No rhinorrhea.     Mouth/Throat:     Mouth: Mucous membranes are moist.     Pharynx: Oropharynx is clear. Posterior oropharyngeal erythema present. No oropharyngeal exudate.  Eyes:     Extraocular Movements: Extraocular movements intact.  Cardiovascular:     Rate and Rhythm: Normal rate and regular rhythm.     Pulses: Normal pulses.     Heart sounds: Normal heart sounds.  Pulmonary:     Effort: Pulmonary effort is normal.     Breath sounds: Normal breath sounds.  Musculoskeletal:     Cervical back: Normal range of motion and neck supple.  Neurological:     Mental Status: He is alert and oriented to person, place, and time. Mental status is at baseline.      UC Treatments / Results  Labs (all labs ordered are listed, but only abnormal results are displayed) Labs Reviewed - No data to display  EKG   Radiology No results found.  Procedures Procedures (including critical care time)  Medications Ordered in UC Medications - No data to display  Initial Impression / Assessment and Plan / UC Course  I have reviewed the triage vital signs and the nursing notes.  Pertinent labs & imaging results that were available during my care of the patient were reviewed by me and considered in my medical decision making (see chart for details).  Acute nonrecurrent maxillary sinusitis  Patient is in no signs of distress nor toxic appearing.  Vital signs are stable.  Low suspicion for pneumonia, pneumothorax or bronchitis and therefore will defer imaging.  Prescribed Augmentin and Zofran.  Blood-tinged sputum is most likely due to dryness, discussed supportive measures.May use additional over-the-counter medications as needed for  supportive care.  May follow-up with urgent care as needed if symptoms persist or worsen.    Final Clinical Impressions(s) / UC Diagnoses   Final diagnoses:  Acute non-recurrent maxillary sinusitis     Discharge Instructions      Today you are being treated for sinusitis  May use Augmentin every morning and every night for 7 days  declining bacteria contributing to symptoms  May use Zofran every 8 hours as needed for nausea, placed under tongue and allow to dissolve  Blood in the mucus is most likely due to a dry airway due to copious amounts of congestion, you may use over-the-counter nasal spray such as saline mist, petroleum Q-tips and humidifier or steam shower to help sooth symptoms    You can take Tylenol and/or Ibuprofen as needed for fever reduction and pain relief.   For cough: honey 1/2 to 1 teaspoon (you can dilute the honey in water or another fluid).  You can also use guaifenesin and dextromethorphan for cough. You can use a humidifier for chest congestion and cough.  If you don't have a humidifier, you can sit in the bathroom with the hot shower running.      For sore throat: try warm salt water gargles, cepacol lozenges, throat spray, warm tea or water with lemon/honey, popsicles or ice, or OTC cold relief medicine for throat discomfort.   For congestion: take a daily anti-histamine like Zyrtec, Claritin, and a oral decongestant, such as pseudoephedrine.  You can also use Flonase 1-2 sprays in each nostril daily.   It is important to stay hydrated: drink plenty of fluids (water, gatorade/powerade/pedialyte, juices, or teas) to keep your throat moisturized and help further relieve irritation/discomfort.    ED Prescriptions     Medication Sig Dispense Auth. Provider   amoxicillin-clavulanate (AUGMENTIN) 875-125 MG tablet Take 1 tablet by mouth every 12 (twelve) hours. 14 tablet Aalyssa Elderkin R, NP   ondansetron (ZOFRAN-ODT) 4 MG disintegrating tablet Take 1 tablet  (4 mg total) by mouth every 8 (eight) hours as needed. 20 tablet Valinda Hoar, NP      PDMP not reviewed this encounter.   Valinda Hoar, [place] 12/23/23 [phone]

## 2023-12-23 NOTE — ED Triage Notes (Signed)
Reports symptoms started 4-5 days ago.  Patient complains patient reports he is coughing up phlegm with blood in it. Has had a headache over the week end.  Headache associated with nausea.   Today has a mild headache ( has a migraine routine)mild nausea-due to tasting blood.

## 2023-12-23 NOTE — Discharge Instructions (Signed)
Today you are being treated for sinusitis  May use Augmentin every morning and every night for 7 days declining bacteria contributing to symptoms  May use Zofran every 8 hours as needed for nausea, placed under tongue and allow to dissolve  Blood in the mucus is most likely due to a dry airway due to copious amounts of congestion, you may use over-the-counter nasal spray such as saline mist, petroleum Q-tips and humidifier or steam shower to help sooth symptoms    You can take Tylenol and/or Ibuprofen as needed for fever reduction and pain relief.   For cough: honey 1/2 to 1 teaspoon (you can dilute the honey in water or another fluid).  You can also use guaifenesin and dextromethorphan for cough. You can use a humidifier for chest congestion and cough.  If you don't have a humidifier, you can sit in the bathroom with the hot shower running.      For sore throat: try warm salt water gargles, cepacol lozenges, throat spray, warm tea or water with lemon/honey, popsicles or ice, or OTC cold relief medicine for throat discomfort.   For congestion: take a daily anti-histamine like Zyrtec, Claritin, and a oral decongestant, such as pseudoephedrine.  You can also use Flonase 1-2 sprays in each nostril daily.   It is important to stay hydrated: drink plenty of fluids (water, gatorade/powerade/pedialyte, juices, or teas) to keep your throat moisturized and help further relieve irritation/discomfort.

## 2024-04-01 ENCOUNTER — Encounter: Payer: Self-pay | Admitting: Family Medicine

## 2024-06-01 NOTE — Progress Notes (Unsigned)
 NEUROLOGY FOLLOW UP OFFICE NOTE  Chase White [id number]  Assessment/Plan:   Migraine with aura, without status migrainosus, not intractable   Migraine prevention:  topiramate  100mg  at bedtime *** Migraine rescue:  rizatriptan  10mg   *** Limit use of pain relievers to no more than 2 days out of week to prevent risk of rebound or medication-overuse headache. Keep headache diary Follow up one year      Subjective:  Chase White is a 43 year old right-handed male who follows up for migraine.   UPDATE: Duration:  2 hours with rizatriptan  Frequency:  2-3 days over last year Current NSAIDS/analgesics:  Ibuprofen 800mg  Current triptans:  Maxalt  10mg  Current ergotamine:  none Current anti-emetic:  none Current muscle relaxants:  none Current Antihypertensive medications:  none Current Antidepressant medications:  none Current Anticonvulsant medications:  topiramate  100mg  QHS Current anti-CGRP:  none Current Vitamins/Herbal/Supplements:  none Current Antihistamines/Decongestants:  none Other therapy:  OMM Hormone/birth control:  none   Caffeine:  Backed off but started drinking again on average 2 cups coffee daily.  No soda. Diet:  Drinks plenty of water.  Keto diet but has more off days now.  Cut down on sodium intake.   Exercise:  not Depression:  no; Anxiety:  no Other pain:  Shoulder pain (rotator cuff tear) Sleep hygiene: lately restless but usually good.   HISTORY:  He had a first-time migraine over this past year.  it was severe but can't remember head location.  It was preceded by a streak of light across his vision, and associated with nausea, motion sickness, photophobia and phonophobia.  No weakness and numbness.  No known trigger.  It lasted about 4-5 hours and resolved  He had a second similar headache on 10/30 that occurred spontaneously.  Over the next week it would come and go without aura, lasting a couple of hours.  He went to [facility] on  10/03/2020 for intractable headache after being advised to be tested for COVID-19, which was negative.  He had a CT of the head on 10/18/2020 which was personally reviewed and was negative.  It continued to be daily, but without aura.  He was placed on daily Ubrelvy which helped.  Due to ongoing headaches, he went to the ED at [facility] on 10/27/2020 where he was given referral to neurology and advised to follow up with his eye doctor.  Last severe headache roughly a week later.  He still has a dull persistent left retro-orbital pressure.  He has been taking ibuprofen about once a week.  Work is all on computer so screen time aggravates it.  Since these headaches, he has been very dizzy and prone to motion sickness, such as riding in a car.  MRI of brain with and without contrast on 12/10/2020 was normal.     Past NSAIDS/analgesics:  none Past abortive triptans:  none Past abortive ergotamine:  none Past muscle relaxants:  none Past anti-emetic:  none Past antihypertensive medications:  none Past antidepressant medications:  nortriptyline  (tachycardia), venlafaxine  Past anticonvulsant medications:  none Past anti-CGRP:  [place] Past vitamins/Herbal/Supplements:  none Past antihistamines/decongestants:  meclizine Other past therapies:  none     Family history of headache:  He thinks migraines may run in the family.  PAST MEDICAL HISTORY: Past Medical History:  Diagnosis Date   Patient on ketogenic diet     MEDICATIONS: Current Outpatient Medications on File Prior to Visit  Medication Sig Dispense Refill   amoxicillin -clavulanate (AUGMENTIN ) 875-125 MG tablet  Take 1 tablet by mouth every 12 (twelve) hours. 14 tablet 0   cholecalciferol (VITAMIN D3) 25 MCG (1000 UNIT) tablet Take 1,000 Units by mouth daily.     Cobalamin Combinations (NEURIVA PLUS) CAPS      fluticasone (FLONASE) 50 MCG/ACT nasal spray Place 1 spray into both nostrils 2 (two) times daily. (Patient not taking: Reported on  12/23/2023)     ibuprofen (ADVIL) 800 MG tablet Take 800 mg by mouth as needed.     ondansetron  (ZOFRAN -ODT) 4 MG disintegrating tablet Take 1 tablet (4 mg total) by mouth every 8 (eight) hours as needed. 20 tablet 0   polyethylene glycol (MIRALAX / GLYCOLAX) 17 g packet Take 17 g by mouth daily.     rizatriptan  (MAXALT ) 5 MG tablet Take 1 tablet (5 mg total) by mouth as needed for migraine. May repeat in 2 hours if needed 30 tablet 3   Tart Cherry 1200 MG CAPS Take by mouth.     topiramate  (TOPAMAX ) 100 MG tablet TAKE 1 TABLET BY MOUTH EVERYDAY AT BEDTIME 90 tablet 3   No current facility-administered medications on file prior to visit.    ALLERGIES: Allergies  Allergen Reactions   Codeine     FAMILY HISTORY: No family history on file.    Objective:  *** General: No acute distress.  Patient appears well-groomed.   Head:  Normocephalic/atraumatic Neck:  Supple.  No paraspinal tenderness.  Full range of motion. Heart:  Regular rate and rhythm. Neuro:  Alert and oriented.  Speech fluent and not dysarthric.  Language intact.  CN II-XII intact.  Bulk and tone normal.  Muscle strength 5/5 throughout.  Sensation to light touch intact.  Deep tendon reflexes 2+ throughout, toes downgoing.  Gait normal.  Romberg negative.    Juliene Dunnings, DO   CC:  Kennieth Leech, MD

## 2024-06-02 ENCOUNTER — Ambulatory Visit (INDEPENDENT_AMBULATORY_CARE_PROVIDER_SITE_OTHER): Payer: Commercial Managed Care - PPO | Admitting: Neurology

## 2024-06-02 ENCOUNTER — Encounter: Payer: Self-pay | Admitting: Neurology

## 2024-06-02 DIAGNOSIS — G43801 Other migraine, not intractable, with status migrainosus: Secondary | ICD-10-CM

## 2024-06-02 MED ORDER — TOPIRAMATE 100 MG PO TABS: ORAL_TABLET | ORAL | 3 refills | Status: AC

## 2024-06-02 MED ORDER — RIZATRIPTAN BENZOATE 5 MG PO TABS: 5.0000 mg | ORAL_TABLET | ORAL | 3 refills | Status: AC | PRN

## 2024-09-16 ENCOUNTER — Encounter: Payer: Self-pay | Admitting: Neurology

## 2024-09-17 ENCOUNTER — Other Ambulatory Visit: Payer: Self-pay | Admitting: Neurology

## 2024-09-17 MED ORDER — PREDNISONE 10 MG PO TABS: ORAL_TABLET | ORAL | 0 refills | Status: DC

## 2024-09-26 DIAGNOSIS — K219 Gastro-esophageal reflux disease without esophagitis: Secondary | ICD-10-CM | POA: Insufficient documentation

## 2024-10-19 ENCOUNTER — Ambulatory Visit
Admission: EM | Admit: 2024-10-19 | Discharge: 2024-10-19 | Disposition: A | Discharge status: Home / Self Care | Attending: Emergency Medicine | Admitting: Emergency Medicine

## 2024-10-19 DIAGNOSIS — J069 Acute upper respiratory infection, unspecified: Secondary | ICD-10-CM

## 2024-10-19 HISTORY — DX: Migraine, unspecified, not intractable, without status migrainosus: G43.909

## 2024-10-19 LAB — POCT RAPID STREP A (OFFICE): Rapid Strep A Screen: NEGATIVE

## 2024-10-19 MED ORDER — AMOXICILLIN-POT CLAVULANATE 875-125 MG PO TABS: 1.0000 | ORAL_TABLET | Freq: Two times a day (BID) | ORAL | 0 refills | Status: DC

## 2024-10-19 NOTE — Discharge Instructions (Addendum)
 Continue symptomatic treatment as directed for at least another couple of days.  If you are not improving, take the Augmentin  as directed.    Follow-up with your primary care provider if you are not improving.

## 2024-10-19 NOTE — ED Triage Notes (Signed)
 Patient to Urgent Care with complaints of  drainage/ upset stomach/ coughing/ nasal congestion/ fever/ poor sleep/ sore throat.  Symptoms started 11/20.  Taking dayquil/ nyquil/ ibuprofen.

## 2024-10-19 NOTE — ED Provider Notes (Signed)
 [name]    CSN: [id number] Arrival date & time: 10/19/24  1401      History   Chief Complaint Chief Complaint  Patient presents with   Cough   Fever    HPI Chase White is a 43 y.o. male.  Patient presents with 4-day history of fever, congestion, postnasal drip, cough, sore throat, loose stools.  Tmax 99.8.  1 loose stool today and 2 yesterday.  No shortness of breath, abdominal pain, vomiting.  Patient has been treating his symptoms with DayQuil and NyQuil.  The history is provided by the patient and medical records.    Past Medical History:  Diagnosis Date   Migraines    Patient on ketogenic diet     Patient Active Problem List   Diagnosis Date Noted   Partial nontraumatic tear of rotator cuff, right 04/18/2023   Weakness of right shoulder 03/20/2023   Pain in right acromioclavicular joint 01/23/2023   Cervical pain (neck) 04/21/2021   Somatic dysfunction of spine, cervical 04/21/2021    Past Surgical History:  Procedure Laterality Date   APPENDECTOMY     TONSILLECTOMY     VASECTOMY         Home Medications    Prior to Admission medications   Medication Sig Start Date End Date Taking? Authorizing Provider  amoxicillin -clavulanate (AUGMENTIN ) 875-125 MG tablet Take 1 tablet by mouth every 12 (twelve) hours. 10/19/24  Yes Corlis Burnard DEL, NP  predniSONE  (DELTASONE ) 10 MG tablet Take 60mg  on day 1, then 50mg  on day 2, then 40mg  on day 3, then 30mg  on day 4, then 20mg  on day 5, then 10mg  on day 6. Patient not taking: Reported on 10/19/2024 09/17/24   Skeet Juliene SAUNDERS, DO  cholecalciferol (VITAMIN D3) 25 MCG (1000 UNIT) tablet Take 1,000 Units by mouth daily.    [provider]  Cobalamin Combinations (NEURIVA PLUS) CAPS  01/24/21   [provider]  ibuprofen (ADVIL) 800 MG tablet Take 800 mg by mouth as needed. 10/10/20   [provider]  polyethylene glycol (MIRALAX / GLYCOLAX) 17 g packet Take 17 g by mouth daily.     [provider]  rizatriptan  (MAXALT ) 5 MG tablet Take 1 tablet (5 mg total) by mouth as needed for migraine. May repeat in 2 hours if needed 06/02/24   Skeet Juliene SAUNDERS, DO  Tart Cherry 1200 MG CAPS Take by mouth.    [provider]  topiramate  (TOPAMAX ) 100 MG tablet TAKE 1 TABLET BY MOUTH EVERYDAY AT BEDTIME 06/02/24   Skeet Juliene SAUNDERS, DO    Family History History reviewed. No pertinent family history.  Social History Social History   Tobacco Use   Smoking status: Former   Smokeless tobacco: Never  Vaping Use   Vaping status: Never Used  Substance Use Topics   Alcohol use: Yes    Alcohol/week: 2.0 - 3.0 standard drinks of alcohol    Types: 2 - 3 Standard drinks or equivalent per week    Comment: occ.   Drug use: Never     Allergies   Codeine   Review of Systems Review of Systems  Constitutional:  Positive for fever. Negative for chills.  HENT:  Positive for congestion, postnasal drip, rhinorrhea and sore throat. Negative for ear pain.   Respiratory:  Positive for cough. Negative for shortness of breath.   Gastrointestinal:  Negative for abdominal pain, diarrhea and vomiting.     Physical Exam Triage Vital Signs ED Triage Vitals  Encounter Vitals Group     BP      Girls Systolic BP Percentile      Girls Diastolic BP Percentile      Boys Systolic BP Percentile      Boys Diastolic BP Percentile      Pulse      Resp      Temp      Temp src      SpO2      Weight      Height      Head Circumference      Peak Flow      Pain Score      Pain Loc      Pain Education      Exclude from Growth Chart    No data found.  Updated Vital Signs BP 112/80   Pulse 91   Temp 97.9 F (36.6 C)   Resp 18   SpO2 99%   Visual Acuity Right Eye Distance:   Left Eye Distance:   Bilateral Distance:    Right Eye Near:   Left Eye Near:    Bilateral Near:     Physical Exam Constitutional:      General: He is not in acute distress. HENT:     Right Ear:  Tympanic membrane normal.     Left Ear: Tympanic membrane normal.     Nose: Congestion present.     Mouth/Throat:     Mouth: Mucous membranes are moist.     Pharynx: Posterior oropharyngeal erythema present.     Comments: PND Cardiovascular:     Rate and Rhythm: Normal rate and regular rhythm.     Heart sounds: Normal heart sounds.  Pulmonary:     Effort: Pulmonary effort is normal. No respiratory distress.     Breath sounds: Normal breath sounds.  Abdominal:     General: Bowel sounds are normal.     Palpations: Abdomen is soft.     Tenderness: There is no abdominal tenderness. There is no guarding or rebound.  Neurological:     Mental Status: He is alert.      UC Treatments / Results  Labs (all labs ordered are listed, but only abnormal results are displayed) Labs Reviewed  POCT RAPID STREP A (OFFICE) - Normal    EKG   Radiology No results found.  Procedures Procedures (including critical care time)  Medications Ordered in UC Medications - No data to display  Initial Impression / Assessment and Plan / UC Course  I have reviewed the triage vital signs and the nursing notes.  Pertinent labs & imaging results that were available during my care of the patient were reviewed by me and considered in my medical decision making (see chart for details).    Acute upper respiratory infection.  Afebrile and vital signs are stable.  Lungs are clear and O2 sat is 99% on room air.  Patient has been symptomatic for 4 days.  Instructed him to continue symptomatic treatment for at least another couple of days.  He has been using DayQuil and NyQuil.  If he is not improving in another 2 days, discussed that he can start a 7-day course of Augmentin .  Education provided on URI.  Instructed him to follow-up with his PCP if he is not improving.  He agrees to plan of care.  Final Clinical Impressions(s) / UC Diagnoses   Final diagnoses:  Acute upper respiratory infection     Discharge  Instructions  Continue symptomatic treatment as directed for at least another couple of days.  If you are not improving, take the Augmentin  as directed.    Follow-up with your primary care provider if you are not improving.     ED Prescriptions     Medication Sig Dispense Auth. Provider   amoxicillin -clavulanate (AUGMENTIN ) 875-125 MG tablet Take 1 tablet by mouth every 12 (twelve) hours. 14 tablet Corlis Burnard DEL, NP      PDMP not reviewed this encounter.   Corlis Burnard DEL, NP 10/19/24 [phone]

## 2024-10-28 ENCOUNTER — Ambulatory Visit
Admission: RE | Admit: 2024-10-28 | Discharge: 2024-10-28 | Disposition: A | Discharge status: Home / Self Care | Attending: Emergency Medicine

## 2024-10-28 VITALS — BP 108/73 | HR 82 | Temp 98.5°F | Resp 18

## 2024-10-28 DIAGNOSIS — R059 Cough, unspecified: Secondary | ICD-10-CM | POA: Diagnosis not present

## 2024-10-28 DIAGNOSIS — J069 Acute upper respiratory infection, unspecified: Secondary | ICD-10-CM

## 2024-10-28 DIAGNOSIS — R1031 Right lower quadrant pain: Secondary | ICD-10-CM

## 2024-10-28 MED ORDER — AZITHROMYCIN 250 MG PO TABS: 250.0000 mg | ORAL_TABLET | Freq: Every day | ORAL | 0 refills | Status: DC

## 2024-10-28 MED ORDER — PROMETHAZINE-DM 6.25-15 MG/5ML PO SYRP: 5.0000 mL | ORAL_SOLUTION | Freq: Four times a day (QID) | ORAL | 0 refills | Status: DC | PRN

## 2024-10-28 NOTE — Discharge Instructions (Addendum)
 Take the Zithromax as directed.    Take the Promethazine  DM as directed.  Do not drive, operate machinery, drink alcohol, or perform dangerous activities while taking this medication as it may cause drowsiness.  Follow up with your primary care provider.  Go to the emergency department if you have worsening symptoms.

## 2024-10-28 NOTE — ED Triage Notes (Signed)
 Patient to Urgent Care with complaints of  persistent cough/ neck and ear pain (left sided)/ right sided abdominal pain and diarrhea.  Symptoms x2 weeks. Completed 7-day course of augmentin . No longer having fevers.

## 2024-10-28 NOTE — ED Provider Notes (Signed)
 Chase White    CSN: [id number] Arrival date & time: 10/28/24  1418      History   Chief Complaint Chief Complaint  Patient presents with   Cough    Persistent cough, headache, earache, sore throat, abdominal pain, sleeplessness, fatigue - Entered by patient    HPI Chase White is a 43 y.o. male.  Patient presents with 2-week history of persistent cough.  The cough is mostly nonproductive.  He also reports left ear pain.  He also reports right lower abdominal pain which he attributes to coughing.  He had diarrhea while taking the Augmentin  but this has resolved.  No diarrhea in the last 2 days.  He has history of appendectomy.  No fever, chest pain, shortness of breath, vomiting.  Patient was seen here on 10/19/2024; diagnosed with acute upper respiratory infection; treated with Augmentin .  The history is provided by the patient and medical records.    Past Medical History:  Diagnosis Date   Migraines    Patient on ketogenic diet     Patient Active Problem List   Diagnosis Date Noted   Partial nontraumatic tear of rotator cuff, right 04/18/2023   Weakness of right shoulder 03/20/2023   Pain in right acromioclavicular joint 01/23/2023   Cervical pain (neck) 04/21/2021   Somatic dysfunction of spine, cervical 04/21/2021    Past Surgical History:  Procedure Laterality Date   APPENDECTOMY     TONSILLECTOMY     VASECTOMY         Home Medications    Prior to Admission medications   Medication Sig Start Date End Date Taking? Authorizing Provider  azithromycin (ZITHROMAX) 250 MG tablet Take 1 tablet (250 mg total) by mouth daily. Take first 2 tablets together, then 1 every day until finished. 10/28/24  Yes Corlis Burnard DEL, NP  predniSONE  (DELTASONE ) 10 MG tablet Take 60mg  on day 1, then 50mg  on day 2, then 40mg  on day 3, then 30mg  on day 4, then 20mg  on day 5, then 10mg  on day 6. Patient not taking: Reported on 10/19/2024 09/17/24   Skeet Juliene SAUNDERS, DO   promethazine -dextromethorphan (PROMETHAZINE -DM) 6.25-15 MG/5ML syrup Take 5 mLs by mouth 4 (four) times daily as needed. 10/28/24  Yes Corlis Burnard DEL, NP  cholecalciferol (VITAMIN D3) 25 MCG (1000 UNIT) tablet Take 1,000 Units by mouth daily.    [provider]  Cobalamin Combinations (NEURIVA PLUS) CAPS  01/24/21   [provider]  ibuprofen (ADVIL) 800 MG tablet Take 800 mg by mouth as needed. 10/10/20   [provider]  polyethylene glycol (MIRALAX / GLYCOLAX) 17 g packet Take 17 g by mouth daily.    [provider]  rizatriptan  (MAXALT ) 5 MG tablet Take 1 tablet (5 mg total) by mouth as needed for migraine. May repeat in 2 hours if needed 06/02/24   Skeet Juliene SAUNDERS, DO  Tart Cherry 1200 MG CAPS Take by mouth.    [provider]  topiramate  (TOPAMAX ) 100 MG tablet TAKE 1 TABLET BY MOUTH EVERYDAY AT BEDTIME 06/02/24   Skeet Juliene SAUNDERS, DO    Family History History reviewed. No pertinent family history.  Social History Social History   Tobacco Use   Smoking status: Former   Smokeless tobacco: Never  Vaping Use   Vaping status: Never Used  Substance Use Topics   Alcohol use: Yes    Alcohol/week: 2.0 - 3.0 standard drinks of alcohol    Types: 2 - 3 Standard drinks or equivalent  per week    Comment: occ.   Drug use: Never     Allergies   Codeine   Review of Systems Review of Systems  Constitutional:  Negative for chills and fever.  HENT:  Positive for ear pain. Negative for ear discharge and sore throat.   Respiratory:  Positive for cough. Negative for shortness of breath.   Cardiovascular:  Negative for chest pain and palpitations.  Gastrointestinal:  Positive for abdominal pain. Negative for diarrhea, nausea and vomiting.     Physical Exam Triage Vital Signs ED Triage Vitals  Encounter Vitals Group     BP 10/28/24 1444 108/73     Girls Systolic BP Percentile --      Girls Diastolic BP Percentile --      Boys Systolic BP Percentile  --      Boys Diastolic BP Percentile --      Pulse Rate 10/28/24 1444 82     Resp 10/28/24 1444 18     Temp 10/28/24 1444 98.5 F (36.9 C)     Temp src --      SpO2 10/28/24 1444 97 %     Weight --      Height --      Head Circumference --      Peak Flow --      Pain Score 10/28/24 1450 7     Pain Loc --      Pain Education --      Exclude from Growth Chart --    No data found.  Updated Vital Signs BP 108/73   Pulse 82   Temp 98.5 F (36.9 C)   Resp 18   SpO2 97%   Visual Acuity Right Eye Distance:   Left Eye Distance:   Bilateral Distance:    Right Eye Near:   Left Eye Near:    Bilateral Near:     Physical Exam Constitutional:      General: He is not in acute distress. HENT:     Right Ear: Tympanic membrane normal.     Left Ear: Tympanic membrane normal.     Nose: Nose normal.     Mouth/Throat:     Mouth: Mucous membranes are moist.     Pharynx: Oropharynx is clear.  Cardiovascular:     Rate and Rhythm: Normal rate and regular rhythm.     Heart sounds: Normal heart sounds.  Pulmonary:     Effort: Pulmonary effort is normal. No respiratory distress.     Breath sounds: Normal breath sounds.  Abdominal:     General: Bowel sounds are normal.     Palpations: Abdomen is soft.     Tenderness: There is no abdominal tenderness. There is no guarding or rebound.  Neurological:     Mental Status: He is alert.      UC Treatments / Results  Labs (all labs ordered are listed, but only abnormal results are displayed) Labs Reviewed - No data to display  EKG   Radiology No results found.  Procedures Procedures (including critical care time)  Medications Ordered in UC Medications - No data to display  Initial Impression / Assessment and Plan / UC Course  I have reviewed the triage vital signs and the nursing notes.  Pertinent labs & imaging results that were available during my care of the patient were reviewed by me and considered in my medical  decision making (see chart for details).    Cough, URI, right lower quadrant abdominal pain.  Afebrile and vital signs are stable.  Patient's medical history includes appendectomy.  He had diarrhea while taking the antibiotic but this has resolved.  His lungs are clear today and O2 sat is 97% on room air.  Treating today with Zithromax.  Also treating cough with Promethazine  DM; precautions for drowsiness with promethazine  discussed.  Instructed him to follow-up with his PCP.  Appointment scheduled for him to establish a PCP.  ED precautions given.  Education provided on adult cough, URI, abdominal pain.  He agrees to plan of care.  Final Clinical Impressions(s) / UC Diagnoses   Final diagnoses:  Cough, unspecified type  Acute upper respiratory infection  Right lower quadrant abdominal pain     Discharge Instructions      Take the Zithromax as directed.    Take the Promethazine  DM as directed.  Do not drive, operate machinery, drink alcohol, or perform dangerous activities while taking this medication as it may cause drowsiness.  Follow up with your primary care provider.  Go to the emergency department if you have worsening symptoms.      ED Prescriptions     Medication Sig Dispense Auth. Provider   azithromycin (ZITHROMAX) 250 MG tablet Take 1 tablet (250 mg total) by mouth daily. Take first 2 tablets together, then 1 every day until finished. 6 tablet Corlis Burnard DEL, NP   promethazine -dextromethorphan (PROMETHAZINE -DM) 6.25-15 MG/5ML syrup Take 5 mLs by mouth 4 (four) times daily as needed. 118 mL Corlis Burnard DEL, NP      PDMP not reviewed this encounter.   Corlis Burnard DEL, NP 10/28/24 [phone]

## 2024-12-10 ENCOUNTER — Encounter: Payer: Self-pay | Admitting: General Practice

## 2024-12-11 ENCOUNTER — Encounter: Payer: Self-pay | Admitting: General Practice

## 2024-12-11 ENCOUNTER — Ambulatory Visit: Admitting: General Practice

## 2024-12-11 VITALS — BP 118/82 | HR 93 | Temp 97.5°F | Ht 71.0 in | Wt 190.0 lb

## 2024-12-11 DIAGNOSIS — Z1283 Encounter for screening for malignant neoplasm of skin: Secondary | ICD-10-CM | POA: Insufficient documentation

## 2024-12-11 DIAGNOSIS — R7303 Prediabetes: Secondary | ICD-10-CM

## 2024-12-11 DIAGNOSIS — R8290 Unspecified abnormal findings in urine: Secondary | ICD-10-CM | POA: Diagnosis not present

## 2024-12-11 DIAGNOSIS — K219 Gastro-esophageal reflux disease without esophagitis: Secondary | ICD-10-CM

## 2024-12-11 DIAGNOSIS — Z1211 Encounter for screening for malignant neoplasm of colon: Secondary | ICD-10-CM | POA: Insufficient documentation

## 2024-12-11 DIAGNOSIS — Z7689 Persons encountering health services in other specified circumstances: Secondary | ICD-10-CM | POA: Insufficient documentation

## 2024-12-11 DIAGNOSIS — Z23 Encounter for immunization: Secondary | ICD-10-CM

## 2024-12-11 MED ORDER — OMEPRAZOLE 20 MG PO CPDR: 20.0000 mg | DELAYED_RELEASE_CAPSULE | Freq: Every day | ORAL | 1 refills | Status: AC | PRN

## 2024-12-11 NOTE — Assessment & Plan Note (Signed)
Will check A1c at next visit

## 2024-12-11 NOTE — Addendum Note (Signed)
 Addended by: TENNIE RAISIN B on: 12/11/2024 09:24 AM   Modules accepted: Orders

## 2024-12-11 NOTE — Assessment & Plan Note (Signed)
 Will check PSA level at next visit.

## 2024-12-11 NOTE — Patient Instructions (Addendum)
 You will either be contacted via phone regarding your referral to gastroenterology and dermatology , or you may receive a letter on your MyChart portal from our referral team with instructions for scheduling an appointment. Please let us  know if you have not been contacted by anyone within two weeks.   Refill sent for omeprazole  20 mg once daily as needed. If your symptoms come back and you have to take it daily, please let me know.  Follow up in 4 weeks for physical and fasting labs.   It was a pleasure to meet you today! Please don't hesitate to contact me with any questions. Welcome to [facility]!

## 2024-12-11 NOTE — Assessment & Plan Note (Signed)
 EMR reviewed briefly.

## 2024-12-11 NOTE — Progress Notes (Signed)
 "  New Patient Office Visit  Subjective    Patient ID: Chase White, male    DOB: 1981-09-28  Age: 44 y.o. MRN: [id number]  CC:  Chief Complaint  Patient presents with   New Patient (Initial Visit)    Establish care   Gastroesophageal Reflux    Started prilosec about 1-2 months ago; has helped but thinks he has some irritation in his throat that may need to get checked.    Referral    Would like to see dermatology for some skin issues he's concerns about and referral for colonoscopy     HPI Chase White is a 44 y.o. male presents to establish care.  Previous pcp/physical/labs: No one since covid.   Discussed the use of AI scribe software for clinical note transcription with the patient, who gave verbal consent to proceed.  History of Present Illness He has experienced recent onset of acid reflux symptoms, including esophageal discomfort, a sensation of fullness, and a feeling of acid rising in the throat, particularly in the mornings. He self-initiated omeprazole  20 mg daily, which alleviated the symptoms. Additionally, he experienced nausea and right lower abdominal pain, which resolved with the use of omeprazole . He has been off omeprazole  for two days without recurrence of symptoms yet.  He has a history of constipation and uses Miralax to maintain regular bowel movements, which he takes a few times a week. He has a history of anal fissures and uses stool softeners to avoid straining.  He uses ibuprofen 200 mg, two tablets a few times a week, primarily for shoulder pain. He has a history of neck and back pain and follows up with orthopedics as needed. His shoulder has been fine since his last orthopedic visit.  He has a history of migraines, for which he takes Topamax  100 mg daily and Maxalt  as needed at the onset of a migraine. He experiences migraines approximately once every six months.  He is concerned about a spot on his shoulder and a lesion on his forehead, which has  grown over the past year and occasionally itches. His brother had a similar issue that required treatment. His mother has a history of basal cell carcinoma.  He follows a keto diet through a work program and has periodic lab work done through [employer]. He was previously prediabetic and has lost weight since starting the keto diet.  He reports frequent cloudiness in his urine but denies any pain, odor, or blood. No known family history of prostate cancer.     Outpatient Encounter Medications as of 12/11/2024  Medication Sig   B Complex Vitamins (VITAMIN B COMPLEX PO) Take by mouth.   cholecalciferol (VITAMIN D3) 25 MCG (1000 UNIT) tablet Take 1,000 Units by mouth daily.   Cobalamin Combinations (NEURIVA PLUS) CAPS    ibuprofen (ADVIL) 800 MG tablet Take 800 mg by mouth as needed.   polyethylene glycol (MIRALAX / GLYCOLAX) 17 g packet Take 17 g by mouth daily.   rizatriptan  (MAXALT ) 5 MG tablet Take 1 tablet (5 mg total) by mouth as needed for migraine. May repeat in 2 hours if needed   Tart Cherry 1200 MG CAPS Take by mouth.   topiramate  (TOPAMAX ) 100 MG tablet TAKE 1 TABLET BY MOUTH EVERYDAY AT BEDTIME   [DISCONTINUED] omeprazole  (PRILOSEC) 20 MG capsule Take 20 mg by mouth daily.   omeprazole  (PRILOSEC) 20 MG capsule Take 1 capsule (20 mg total) by mouth daily as needed.   [DISCONTINUED] azithromycin  (ZITHROMAX ) 250 MG tablet  Take 1 tablet (250 mg total) by mouth daily. Take first 2 tablets together, then 1 every day until finished.   [DISCONTINUED] predniSONE  (DELTASONE ) 10 MG tablet Take 60mg  on day 1, then 50mg  on day 2, then 40mg  on day 3, then 30mg  on day 4, then 20mg  on day 5, then 10mg  on day 6. (Patient not taking: Reported on 10/19/2024)   [DISCONTINUED] promethazine -dextromethorphan (PROMETHAZINE -DM) 6.25-15 MG/5ML syrup Take 5 mLs by mouth 4 (four) times daily as needed.   No facility-administered encounter medications on file as of 12/11/2024.    Past Medical History:   Diagnosis Date   Allergy 2020   Environmental   Cervical pain (neck) 04/21/2021   GERD (gastroesophageal reflux disease)    Migraines    Pain in right acromioclavicular joint 01/23/2023   Injection given January 23, 2023     Partial nontraumatic tear of rotator cuff, right 04/18/2023   PRP Apr 18, 2023     Patient on ketogenic diet    Somatic dysfunction of spine, cervical 04/21/2021   Weakness of right shoulder 03/20/2023    Past Surgical History:  Procedure Laterality Date   APPENDECTOMY     TONSILLECTOMY     VASECTOMY      Family History  Problem Relation Age of Onset   Depression Mother    Anxiety disorder Mother    Arthritis Mother    Alcohol abuse Father    Arthritis Maternal Grandmother    Early death Paternal Grandfather    Cancer Maternal Uncle    Alcohol abuse Brother    Diabetes Maternal Aunt     Social History   Socioeconomic History   Marital status: Married    Spouse name: Not on file   Number of children: Not on file   Years of education: Not on file   Highest education level: Bachelor's degree (e.g., BA, AB, BS)  Occupational History   Not on file  Tobacco Use   Smoking status: Former    Current packs/day: 0.00    Types: Cigarettes    Quit date: 11/26/2003    Years since quitting: 21.0   Smokeless tobacco: Never   Tobacco comments:    Smoked socially in college  Vaping Use   Vaping status: Never Used  Substance and Sexual Activity   Alcohol use: Yes    Alcohol/week: 2.0 standard drinks of alcohol    Types: 2 Standard drinks or equivalent per week    Comment: occ.   Drug use: Never   Sexual activity: Yes    Birth control/protection: Surgical  Other Topics Concern   Not on file  Social History Narrative   Right handed   Social Drivers of Health   Tobacco Use: Medium Risk (12/11/2024)   Patient History    Smoking Tobacco Use: Former    Smokeless Tobacco Use: Never    Passive Exposure: Not on file  Financial Resource Strain:  Low Risk (12/08/2024)   Overall Financial Resource Strain (CARDIA)    Difficulty of Paying Living Expenses: Not hard at all  Food Insecurity: No Food Insecurity (12/08/2024)   Epic    Worried About Programme Researcher, Broadcasting/film/video in the Last Year: Never true    Ran Out of Food in the Last Year: Never true  Transportation Needs: No Transportation Needs (12/08/2024)   Epic    Lack of Transportation (Medical): No    Lack of Transportation (Non-Medical): No  Physical Activity: Insufficiently Active (12/08/2024)   Exercise Vital Sign  Days of Exercise per Week: 3 days    Minutes of Exercise per Session: 10 min  Stress: Stress Concern Present (12/08/2024)   [employer] of Occupational Health - Occupational Stress Questionnaire    Feeling of Stress: To some extent  Social Connections: Socially Isolated (12/08/2024)   Social Connection and Isolation Panel    Frequency of Communication with Friends and Family: Never    Frequency of Social Gatherings with Friends and Family: Once a week    Attends Religious Services: Never    Database Administrator or Organizations: No    Attends Engineer, Structural: Not on file    Marital Status: Married  Intimate Partner Violence: Unknown (03/21/2023)   Received from [facility]   HITS    Physically Hurt: Not on file    Insult or Talk Down To: Not on file    Threaten Physical Harm: Not on file    Scream or Curse: Not on file  Depression (EYV7-0): Not on file  Alcohol Screen: Low Risk (12/08/2024)   Alcohol Screen    Last Alcohol Screening Score (AUDIT): 2  Housing: Low Risk (12/08/2024)   Epic    Unable to Pay for Housing in the Last Year: No    Number of Times Moved in the Last Year: 0    Homeless in the Last Year: No  Utilities: Not on file  Health Literacy: Not on file    Review of Systems  Constitutional:  Negative for chills and fever.  Respiratory:  Negative for shortness of breath.   Cardiovascular:  Negative for chest pain.   Gastrointestinal:  Positive for heartburn. Negative for abdominal pain, constipation, diarrhea, nausea and vomiting.  Genitourinary:  Negative for dysuria, frequency and urgency.  Neurological:  Negative for dizziness and headaches.  Endo/Heme/Allergies:  Negative for polydipsia.  Psychiatric/Behavioral:  Negative for depression and suicidal ideas. The patient is not nervous/anxious.         Objective    BP 118/82   Pulse 93   Temp (!) 97.5 F (36.4 C) (Temporal)   Ht 5' 11 (1.803 m)   Wt 190 lb (86.2 kg)   SpO2 96%   BMI 26.50 kg/m   Physical Exam Vitals and nursing note reviewed.  Constitutional:      Appearance: Normal appearance.  Cardiovascular:     Rate and Rhythm: Normal rate and regular rhythm.     Pulses: Normal pulses.     Heart sounds: Normal heart sounds.  Pulmonary:     Effort: Pulmonary effort is normal.     Breath sounds: Normal breath sounds.  Neurological:     Mental Status: He is alert and oriented to person, place, and time.  Psychiatric:        Mood and Affect: Mood normal.        Behavior: Behavior normal.        Thought Content: Thought content normal.        Judgment: Judgment normal.         Assessment & Plan:  Gastroesophageal reflux disease, unspecified whether esophagitis present -     Ambulatory referral to Gastroenterology -     Omeprazole ; Take 1 capsule (20 mg total) by mouth daily as needed.  Dispense: 30 capsule; Refill: 1  Establishing care with new doctor, encounter for Assessment & Plan: EMR reviewed briefly.     Screening for colon cancer  Skin cancer screening -     Ambulatory referral to Dermatology  Prediabetes Assessment &  Plan: Will check A1c at next visit.   Slightly cloudy urine Assessment & Plan: Will check PSA level at next visit.     Assessment and Plan Assessment & Plan Gastroesophageal reflux disease Symptoms improved with omeprazole , resolved after discontinuation. Possible esophagitis or  ulcer due to NSAID use. Discussed risks of long-term omeprazole  use. - Referred to gastroenterology for colonoscopy and potential endoscopy if symptoms persist. - Prescribed omeprazole  as needed, instructed to monitor symptoms and report recurrence. - Provided handout on dietary modifications to avoid acid reflux triggers. - Advised to reduce NSAID use and consider Tylenol for pain management.  General Health Maintenance Routine health maintenance discussed. Dermatology referral due to family history and concerning skin lesions. - Administered tetanus booster. - Scheduled PSA screening with next physical exam. - Referred to dermatology for skin cancer screening and evaluation of concerning skin lesions.    Return in about 4 weeks (around 01/08/2025) for physical and fasting labs.SABRA Carrol Aurora, NP  "

## 2024-12-22 ENCOUNTER — Ambulatory Visit: Payer: Self-pay

## 2024-12-22 ENCOUNTER — Ambulatory Visit (INDEPENDENT_AMBULATORY_CARE_PROVIDER_SITE_OTHER): Admitting: Family Medicine

## 2024-12-22 ENCOUNTER — Encounter: Payer: Self-pay | Admitting: Family Medicine

## 2024-12-22 VITALS — BP 112/74 | HR 93 | Resp 16 | Ht 71.0 in | Wt 189.7 lb

## 2024-12-22 DIAGNOSIS — J029 Acute pharyngitis, unspecified: Secondary | ICD-10-CM

## 2024-12-22 DIAGNOSIS — R0681 Apnea, not elsewhere classified: Secondary | ICD-10-CM | POA: Diagnosis not present

## 2024-12-22 DIAGNOSIS — K219 Gastro-esophageal reflux disease without esophagitis: Secondary | ICD-10-CM

## 2024-12-22 DIAGNOSIS — R0982 Postnasal drip: Secondary | ICD-10-CM

## 2024-12-22 MED ORDER — LIDOCAINE VISCOUS HCL 2 % MT SOLN: 5.0000 mL | Freq: Four times a day (QID) | OROMUCOSAL | 0 refills | Status: DC | PRN

## 2024-12-22 MED ORDER — FLUTICASONE PROPIONATE 50 MCG/ACT NA SUSP: 2.0000 | Freq: Every day | NASAL | 6 refills | Status: AC

## 2024-12-22 MED ORDER — PREDNISOLONE 15 MG/5ML PO SOLN: 15.0000 mg | Freq: Four times a day (QID) | ORAL | 0 refills | Status: AC | PRN

## 2024-12-22 NOTE — Telephone Encounter (Signed)
 FYI Only or Action Required?: FYI only for provider: appointment scheduled on 12/22/2024 3:00 PM Sowles, Krichna, [facility].  Patient was last seen in primary care on 12/11/2024 by Vincente Shivers, NP.  Called Nurse Triage reporting Sore Throat.  Symptoms began a week ago.  Interventions attempted: OTC medications: tylenol .  Symptoms are: stable.  Triage Disposition: See PCP When Office is Open (Within 3 Days)  Patient/caregiver understands and will follow disposition?: Yes      Reason for Disposition  [1] Sore throat with cough/cold symptoms AND [2] present > 5 days  Answer Assessment - Initial Assessment Questions Patient reports symptoms started 1 week sore throat, its in back of nose as well back of throat where nose meets throat, and dry feeling. Drainage from throat as well . Taste is gross and thinks blood taste. Noticing more blood in throat when spits. Bumps and pustules on uvla. Able to swallow but more painful. Taking tylenol. Requested Booked appointment [facility], 12/22/2024 3:00 PM Glenard, Krichna, MD [facility] pt aware of location and address of office. No opening at PCP today and patient wanting to be seen today    Patient denies the following chest pain, difficulty breathing,fever, vomiting    1. ONSET: When did the throat start hurting? (Hours or days ago)      1 week ago 2. SEVERITY: How bad is the sore throat? (Scale 1-10; mild, moderate or severe)     2/10 now but varies before shower today 5/120   4.  VIRAL SYMPTOMS: Are there any symptoms of a cold, such as a runny nose, cough, hoarse voice or red eyes?      Cough at times, and eyes are watery as well  5. FEVER: Do you have a fever? If Yes, ask: What is your temperature, how was it measured, and when did it start?     Denies  6. PUS ON THE TONSILS: Is there pus on the tonsils in the back of your throat?     No tonsils   7. OTHER SYMPTOMS: Do you have  any other symptoms? (e.g., difficulty breathing, headache, rash)      Patient denies the following chest pain, difficulty breathing,fever, vomiting  Protocols used: Sore Throat-A-AH  Message from [place] [facility] sent at 12/22/2024 12:27 PM EST  Reason for Triage: Persistent sore throat, symptoms getting worse, bumps/clusters on Uvula, spitting up some blood, not sure if acid reflux issues he's having is related.

## 2024-12-22 NOTE — Progress Notes (Addendum)
 Name: Chase White   MRN: [id number]    DOB: 19-Apr-1981   Date:12/22/2024       Progress Note  Subjective  Chief Complaint  Chief Complaint  Patient presents with   Sore Throat    started almost 2 week sore throat, its in back of nose as well back of throat where nose meets throat, and dry feeling. Drainage from throat as well . Taste is gross and thinks blood taste. Noticing more blood in throat when spits. Bumps and pustules on uvla. Able to swallow but more painful.     Discussed the use of AI scribe software for clinical note transcription with the patient, who gave verbal consent to proceed.  History of Present Illness Chase White is a 44 year old male who presents with a sore throat and post-nasal drainage.  He has been experiencing a sore throat for several weeks, initially improving with omeprazole . Symptoms returned after discontinuing the medication for two days. He resumed omeprazole  four days ago, but the sore throat persists.  Today, he noticed an increased amount of blood when spitting after brushing his teeth, which he describes as coming from the back of his throat. He also observed unusual spots on his uvula, described as 'little bumps'.  No fever or chills, but he feels colder than usual, attributing it to the weather. He reports significant post-nasal drainage with a 'gross' taste, suspecting it might be blood. He experiences nasal congestion but has not been blowing his nose much, as most drainage is posterior.  He has a history of seasonal allergies, for which he takes Allegra (fexofenadine) 180 mg once daily, although he has not been taking it recently as symptoms were not bothersome. No headaches, sinus pressure, or facial pressure.  He restarted omeprazole  recently for heartburn symptoms.     Patient Active Problem List   Diagnosis Date Noted   Establishing care with new doctor, encounter for 12/11/2024   Screening for colon cancer 12/11/2024   Skin  cancer screening 12/11/2024   Prediabetes 12/11/2024   Slightly cloudy urine 12/11/2024   Acid reflux 09/26/2024    Social History   Tobacco Use   Smoking status: Former    Current packs/day: 0.00    Types: Cigarettes    Quit date: 11/26/2003    Years since quitting: 21.0   Smokeless tobacco: Never   Tobacco comments:    Smoked socially in college  Substance Use Topics   Alcohol use: Yes    Alcohol/week: 2.0 standard drinks of alcohol    Types: 2 Standard drinks or equivalent per week    Comment: occ.    Current Medications[1]  Allergies[2]  ROS  Ten systems reviewed and is negative except as mentioned in HPI    Objective  Vitals:   12/22/24 1444  BP: 112/74  Pulse: 93  Resp: 16  SpO2: 98%  Weight: 189 lb 11.2 oz (86 kg)  Height: 5' 11 (1.803 m)    Body mass index is 26.46 kg/m.    Physical Exam CONSTITUTIONAL: Patient appears well-developed and well-nourished. No distress. HEENT: Head atraumatic, normocephalic, neck supple. Posterior pharyngeal drainage present. Uvula is red and bumpy with multiple bumps anteriorly CARDIOVASCULAR: Normal rate, regular rhythm and normal heart sounds. No murmur heard. No BLE edema. PULMONARY: Effort normal and breath sounds normal. No respiratory distress. ABDOMINAL: There is no tenderness or distention. MUSCULOSKELETAL: Normal gait. Without gross motor or sensory deficit. PSYCHIATRIC: Patient has a normal mood and affect. Behavior is normal. Judgment and  thought content normal.    Recent Results (from the past 2160 hours)  POCT rapid strep A     Status: Normal   Collection Time: 10/19/24  4:04 PM  Result Value Ref Range   Rapid Strep A Screen Negative Negative    Assessment & Plan Acute pharyngitis with postnasal drip Likely viral etiology with red, bumpy uvula and throat drainage. Differential includes viral illness and allergic rhinitis. Possible GERD contribution. - Prescribed Magic Mouthwash for inflammation  and throat irritation. - Advised to monitor uvula bumps; contact PCP if persistent. - Prescribed nasal spray for inflammation and drainage.  Gastroesophageal reflux disease Symptoms improve with omeprazole  but recur upon discontinuation. Possible contribution to throat irritation and postnasal drip. - Advised to take omeprazole  daily.  Seasonal allergic rhinitis Contributing to postnasal drip and throat irritation. - Advised to resume Allegra.            [1]  Current Outpatient Medications:    B Complex Vitamins (VITAMIN B COMPLEX PO), Take by mouth., Disp: , Rfl:    cholecalciferol (VITAMIN D3) 25 MCG (1000 UNIT) tablet, Take 1,000 Units by mouth daily., Disp: , Rfl:    Cobalamin Combinations (NEURIVA PLUS) CAPS, , Disp: , Rfl:    ibuprofen (ADVIL) 800 MG tablet, Take 800 mg by mouth as needed., Disp: , Rfl:    omeprazole  (PRILOSEC) 20 MG capsule, Take 1 capsule (20 mg total) by mouth daily as needed., Disp: 30 capsule, Rfl: 1   polyethylene glycol (MIRALAX / GLYCOLAX) 17 g packet, Take 17 g by mouth daily., Disp: , Rfl:    rizatriptan  (MAXALT ) 5 MG tablet, Take 1 tablet (5 mg total) by mouth as needed for migraine. May repeat in 2 hours if needed, Disp: 30 tablet, Rfl: 3   Tart Cherry 1200 MG CAPS, Take by mouth., Disp: , Rfl:    topiramate  (TOPAMAX ) 100 MG tablet, TAKE 1 TABLET BY MOUTH EVERYDAY AT BEDTIME, Disp: 90 tablet, Rfl: 3 [2]  Allergies Allergen Reactions   Codeine

## 2024-12-22 NOTE — Telephone Encounter (Signed)
 Next Appt With Family Medicine Roberta Aurora, NP) 01/15/2025 at 8:20 AM

## 2024-12-22 NOTE — Addendum Note (Signed)
 Addended by: GLENARD MIRE F on: 12/22/2024 03:25 PM   Modules accepted: Orders

## 2024-12-23 ENCOUNTER — Encounter: Payer: Self-pay | Admitting: General Practice

## 2024-12-23 DIAGNOSIS — K1379 Other lesions of oral mucosa: Secondary | ICD-10-CM

## 2024-12-23 DIAGNOSIS — J029 Acute pharyngitis, unspecified: Secondary | ICD-10-CM

## 2024-12-23 NOTE — Telephone Encounter (Signed)
 Noted.

## 2024-12-28 ENCOUNTER — Ambulatory Visit: Admitting: Dermatology

## 2024-12-30 NOTE — Addendum Note (Signed)
 Addended by: VINCENTE SHIVERS on: 12/30/2024 11:13 AM   Modules accepted: Orders

## 2024-12-31 ENCOUNTER — Encounter: Payer: Self-pay | Admitting: *Deleted

## 2024-12-31 ENCOUNTER — Ambulatory Visit

## 2024-12-31 VITALS — BP 129/86 | HR 90 | Temp 98.0°F | Ht 71.0 in | Wt 187.5 lb

## 2024-12-31 DIAGNOSIS — K219 Gastro-esophageal reflux disease without esophagitis: Secondary | ICD-10-CM

## 2024-12-31 DIAGNOSIS — J029 Acute pharyngitis, unspecified: Secondary | ICD-10-CM

## 2024-12-31 DIAGNOSIS — Z1211 Encounter for screening for malignant neoplasm of colon: Secondary | ICD-10-CM

## 2024-12-31 DIAGNOSIS — Z800 Family history of malignant neoplasm of digestive organs: Secondary | ICD-10-CM

## 2024-12-31 DIAGNOSIS — Z87891 Personal history of nicotine dependence: Secondary | ICD-10-CM

## 2024-12-31 DIAGNOSIS — K602 Anal fissure, unspecified: Secondary | ICD-10-CM | POA: Insufficient documentation

## 2024-12-31 MED ORDER — LIDOCAINE VISCOUS HCL 2 % MT SOLN: 5.0000 mL | Freq: Four times a day (QID) | OROMUCOSAL | 0 refills | Status: AC | PRN

## 2024-12-31 MED ORDER — NA SULFATE-K SULFATE-MG SULF 17.5-3.13-1.6 GM/177ML PO SOLN: 1.0000 | Freq: Once | ORAL | 0 refills | Status: AC

## 2024-12-31 NOTE — Patient Instructions (Signed)
 Metamucil at 2 to 3 teaspoons at night mixed in 10 ounces of water 

## 2024-12-31 NOTE — Addendum Note (Signed)
 Addended by: Sherlonda Flater, CHAN on: 12/31/2024 01:39 PM   Modules accepted: Orders

## 2024-12-31 NOTE — Addendum Note (Signed)
 Addended by: VINCENTE SHIVERS on: 12/31/2024 08:57 AM   Modules accepted: Orders

## 2024-12-31 NOTE — Progress Notes (Signed)
 "   Gastroenterology Consultation  Referring Provider:     Vincente Shivers, NP Primary Care Physician:  Vincente Shivers, NP Primary Gastroenterologist:  Dr. Theophilus    Reason for Consultation:     Reflux and colorectal cancer screening        HPI:   Chase White is a 44 y.o. y/o male referred for consultation & management of reflux by Dr. Vincente Shivers, NP.  This is a pleasant 44 year old male with a history of migraine headaches and an appendectomy years ago who presents for reflux.  This patient has been well until early November 2025.  At that point he began to develop traditional reflux symptoms.  These are substernal burning.  He did not have any changes in his diet or any obvious precursor explanations for his reflux and that he had not struggled with this in years prior.  The patient had noticed some dysphagia in the distal esophageal area but, that is chronic in nature.  That occurs with solid foods.  He started omeprazole  in December 2025 and felt improved.  He sometimes no takes it with his coffee.  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  He had a flexible sigmoidoscopy.  He takes MiraLAX daily.  He sometimes again struggles with periodic bouts of hematochezia on the toilet paper.   Past Medical History:  Diagnosis Date   Allergy 2020   Environmental   Cervical pain (neck) 04/21/2021   GERD (gastroesophageal reflux disease)    Migraines    Pain in right acromioclavicular joint 01/23/2023   Injection given January 23, 2023     Partial nontraumatic tear of rotator cuff, right 04/18/2023   PRP Apr 18, 2023     Patient on ketogenic diet    Somatic dysfunction of spine, cervical 04/21/2021   Weakness of right shoulder 03/20/2023    Past Surgical History:  Procedure Laterality Date   APPENDECTOMY     TONSILLECTOMY     VASECTOMY      Prior to Admission medications  Medication Sig Start Date End Date Taking? Authorizing Provider  B  Complex Vitamins (VITAMIN B COMPLEX PO) Take by mouth.   Yes [provider]  cholecalciferol (VITAMIN D3) 25 MCG (1000 UNIT) tablet Take 1,000 Units by mouth daily.   Yes [provider]  Cobalamin Combinations (NEURIVA PLUS) CAPS  01/24/21  Yes [provider]  fexofenadine (ALLEGRA) 180 MG tablet Take 180 mg by mouth daily.   Yes [provider]  fluticasone  (FLONASE ) 50 MCG/ACT nasal spray Place 2 sprays into both nostrils daily. 12/22/24  Yes Sowles, Krichna, MD  ibuprofen (ADVIL) 800 MG tablet Take 800 mg by mouth as needed. 10/10/20  Yes [provider]  lidocaine  (XYLOCAINE ) 2 % solution Use as directed 5 mLs in the mouth or throat every 6 (six) hours as needed for mouth pain. Mix at home with liquid benadryl to improve taste and inflammation 12/31/24  Yes Vincente Shivers, NP  omeprazole  (PRILOSEC) 20 MG capsule Take 1 capsule (20 mg total) by mouth daily as needed. 12/11/24  Yes Vincente Shivers, NP  polyethylene glycol (MIRALAX / GLYCOLAX) 17 g packet Take 17 g by mouth daily.   Yes [provider]  prednisoLONE  (PRELONE ) 15 MG/5ML SOLN Take 5 mLs (15 mg total) by mouth 4 (four) times daily as needed. Gargle and spit , mix with maalox at home to improve taste 12/22/24  Yes Sowles, Krichna, MD  rizatriptan  (MAXALT ) 5  MG tablet Take 1 tablet (5 mg total) by mouth as needed for migraine. May repeat in 2 hours if needed 06/02/24  Yes Jaffe, Adam R, DO  Tart Cherry 1200 MG CAPS Take by mouth.   Yes [provider]  topiramate  (TOPAMAX ) 100 MG tablet TAKE 1 TABLET BY MOUTH EVERYDAY AT BEDTIME 06/02/24  Yes Skeet Juliene SAUNDERS, DO    Family History  Problem Relation Age of Onset   Depression Mother    Anxiety disorder Mother    Arthritis Mother    Alcohol abuse Father    Arthritis Maternal Grandmother    Early death Paternal Grandfather    Cancer Maternal Uncle    Alcohol abuse Brother    Diabetes Maternal Aunt      Social  History[1]  Allergies as of 12/31/2024 - Review Complete 12/31/2024  Allergen Reaction Noted   Codeine  07/20/2020    Review of Systems:    All systems reviewed and negative except where noted in HPI.   Physical Exam:  BP 129/86   Pulse 90   Temp 98 F (36.7 C) (Oral)   Ht 5' 11 (1.803 m)   Wt 187 lb 8 oz (85 kg)   SpO2 98%   BMI 26.15 kg/m  No LMP for male patient. General:   Alert,  Well-developed,.  Well-nourished, pleasant and cooperative in NAD Head:  Normocephalic and atraumatic. Eyes:  Sclera clear, no icterus.   Conjunctiva pink. Ears:  Normal auditory acuity. Neck:  Supple; no masses or thyromegaly. Lungs:  Respirations even and unlabored.  Clear throughout to auscultation.   No wheezes, crackles, or rhonchi. No acute distress. Heart:  Regular rate and rhythm; no murmurs, clicks, rubs, or gallops. Abdomen:  Normal bowel sounds.  No bruits.  Soft, non-tender and non-distended without masses, hepatosplenomegaly or hernias noted.  No guarding or rebound tenderness..  Negative Carnett sign.   Rectal:  Deferred.. Pulses:  Normal pulses noted. Extremities:  No clubbing or edema.  No cyanosis. Neurologic:  Alert and oriented x3;  grossly normal neurologically. Skin:  Intact without significant lesions or rashes..  No jaundice. Lymph Nodes:  No significant cervical adenopathy. Psych:  Alert and cooperative. Normal mood and affect.  Imaging Studies: No results found.  Assessment and Plan:   Chase White is a 44 y.o. y/o male with the following problems.  Problem list. 1.  New onset reflux since late 2025 with no clear explanation. 2.  Occurrence sore throat and response to lidocaine ; he is seeing ENT later this month for this. 3.  Patient has a family history towards colon cancer in the uncle.  He also has symptoms highly suggestive of an anal fissure.  Plan. 1.  The patient cannot take omeprazole  daily.  I instructed him to take it prior to his coffee by about  20 minutes with a sip of water. 2.  EGD will be offered to evaluate for hiatal hernia and clearly evaluate for Barrett's esophagus. 3.  Colonoscopy will be offered as well at the same setting.  During colonoscopy detailed anorectal examination.  He would benefit from fiber supplementation prior to colonoscopy for periodically symptomatic anal fissure.  This will be evaluated on rectal exam prior to procedure.    Chase JAYSON Nap, MD. NOLIA    Note: This dictation was prepared with Dragon dictation along with smaller phrase technology. Any transcriptional errors that result from this process are unintentional.      [1]  Social History Tobacco Use  Smoking status: Former    Current packs/day: 0.00    Types: Cigarettes    Quit date: 11/26/2003    Years since quitting: 21.1   Smokeless tobacco: Never   Tobacco comments:    Smoked socially in college  Vaping Use   Vaping status: Never Used  Substance Use Topics   Alcohol use: Yes    Alcohol/week: 2.0 standard drinks of alcohol    Types: 2 Standard drinks or equivalent per week    Comment: occ.   Drug use: Never   "

## 2024-12-31 NOTE — Addendum Note (Signed)
 Addended by: Nino Amano, CHAN on: 12/31/2024 01:42 PM   Modules accepted: Orders

## 2025-01-05 ENCOUNTER — Ambulatory Visit: Admitting: Dermatology

## 2025-01-15 ENCOUNTER — Encounter: Admitting: General Practice

## 2025-01-29 ENCOUNTER — Ambulatory Visit: Admit: 2025-01-29

## 2025-02-09 ENCOUNTER — Ambulatory Visit

## 2025-06-03 ENCOUNTER — Ambulatory Visit: Admitting: Neurology
